# Patient Record
Sex: Female | Born: 1965 | Race: Black or African American | Hispanic: Yes | Marital: Married | Smoking: Former smoker
Health system: Southern US, Community
[De-identification: ages and names within clinical notes are randomized; demographics above are authoritative.]

## PROBLEM LIST (undated history)

## (undated) DIAGNOSIS — M797 Fibromyalgia: Secondary | ICD-10-CM

## (undated) DIAGNOSIS — I10 Essential (primary) hypertension: Secondary | ICD-10-CM

## (undated) DIAGNOSIS — F419 Anxiety disorder, unspecified: Secondary | ICD-10-CM

## (undated) HISTORY — PX: ORIF FINGER / THUMB FRACTURE: SUR932

## (undated) HISTORY — PX: ORIF ELBOW FRACTURE: SHX5031

## (undated) HISTORY — PX: FOOT SURGERY: SHX648

---

## 2005-11-02 ENCOUNTER — Inpatient Hospital Stay (HOSPITAL_COMMUNITY): Admission: EM | Admit: 2005-11-02 | Discharge: 2005-11-04 | Payer: Self-pay | Admitting: Emergency Medicine

## 2006-03-26 ENCOUNTER — Ambulatory Visit: Payer: Self-pay | Admitting: Psychiatry

## 2006-03-26 ENCOUNTER — Inpatient Hospital Stay (HOSPITAL_COMMUNITY): Admission: AD | Admit: 2006-03-26 | Discharge: 2006-03-30 | Payer: Self-pay | Admitting: Psychiatry

## 2007-04-01 ENCOUNTER — Emergency Department (HOSPITAL_COMMUNITY): Admission: EM | Admit: 2007-04-01 | Discharge: 2007-04-01 | Payer: Self-pay | Admitting: Emergency Medicine

## 2007-07-23 ENCOUNTER — Ambulatory Visit: Payer: Self-pay | Admitting: *Deleted

## 2007-07-23 ENCOUNTER — Emergency Department (HOSPITAL_COMMUNITY): Admission: EM | Admit: 2007-07-23 | Discharge: 2007-07-23 | Payer: Self-pay | Admitting: Emergency Medicine

## 2007-07-23 ENCOUNTER — Inpatient Hospital Stay (HOSPITAL_COMMUNITY): Admission: RE | Admit: 2007-07-23 | Discharge: 2007-07-26 | Payer: Self-pay | Admitting: *Deleted

## 2007-08-18 ENCOUNTER — Emergency Department (HOSPITAL_COMMUNITY): Admission: EM | Admit: 2007-08-18 | Discharge: 2007-08-19 | Payer: Self-pay | Admitting: Emergency Medicine

## 2007-12-19 ENCOUNTER — Emergency Department (HOSPITAL_COMMUNITY): Admission: EM | Admit: 2007-12-19 | Discharge: 2007-12-19 | Payer: Self-pay | Admitting: Emergency Medicine

## 2009-11-01 ENCOUNTER — Emergency Department (HOSPITAL_COMMUNITY): Admission: EM | Admit: 2009-11-01 | Discharge: 2009-11-01 | Payer: Self-pay | Admitting: Emergency Medicine

## 2010-02-17 ENCOUNTER — Encounter: Payer: Self-pay | Admitting: *Deleted

## 2010-02-17 ENCOUNTER — Encounter: Payer: Self-pay | Admitting: Internal Medicine

## 2010-04-11 LAB — RAPID URINE DRUG SCREEN, HOSP PERFORMED
Amphetamines: NOT DETECTED
Barbiturates: NOT DETECTED
Benzodiazepines: NOT DETECTED
Opiates: NOT DETECTED
Tetrahydrocannabinol: NOT DETECTED

## 2010-04-11 LAB — PROTIME-INR
INR: 1.03 (ref 0.00–1.49)
Prothrombin Time: 13.7 seconds (ref 11.6–15.2)

## 2010-04-11 LAB — POCT CARDIAC MARKERS
CKMB, poc: <1 ng/mL — ABNORMAL LOW (ref 1.0–8.0)
Myoglobin, poc: 22.3 ng/mL (ref 12–200)
Troponin i, poc: <0.05 ng/mL (ref 0.00–0.09)

## 2010-04-11 LAB — URINALYSIS, ROUTINE W REFLEX MICROSCOPIC
Hgb urine dipstick: NEGATIVE
Ketones, ur: NEGATIVE mg/dL
Protein, ur: NEGATIVE mg/dL
Urobilinogen, UA: 1 mg/dL (ref 0.0–1.0)

## 2010-04-11 LAB — CBC
MCHC: 31.2 g/dL (ref 30.0–36.0)
Platelets: 258 10*3/uL (ref 150–400)
RDW: 15.2 % (ref 11.5–15.5)

## 2010-04-11 LAB — DIFFERENTIAL
Basophils Absolute: 0 10*3/uL (ref 0.0–0.1)
Basophils Relative: 0 % (ref 0–1)
Eosinophils Absolute: 0.1 10*3/uL (ref 0.0–0.7)
Neutro Abs: 7.5 10*3/uL (ref 1.7–7.7)
Neutrophils Relative %: 70 % (ref 43–77)

## 2010-04-11 LAB — URINE MICROSCOPIC-ADD ON

## 2010-06-11 NOTE — Discharge Summary (Signed)
[name], SOLANA NO.:  [id number]   MEDICAL RECORD NO.:  [id number]          PATIENT TYPE:  IPS   LOCATION:  0604                          FACILITY:  [facility]   PHYSICIAN:  Jasmine Pang, M.D. DATE OF BIRTH:  [birth year]   DATE OF ADMISSION:  07/23/2007  DATE OF DISCHARGE:  07/26/2007                               DISCHARGE SUMMARY   IDENTIFICATION:  This is a 45 year old divorced African American female  who was admitted on a voluntary basis on July 23, 2007.   HISTORY OF PRESENT ILLNESS:  The patient called 911 on the day before  admission reporting that she felt suicidal after having a setback  smoking crack.  Apparently, she was last with [place] from March 27, 2007  - March 30, 2007.  She states she has never stopped drinking.  She did  use marijuana and cocaine on the day before admission.  However, she  states she used to use when people called her and stated they want to  use it with them.  She does not actually seek this out.  She does  acknowledge that she has a drinking problem and that is her drug of  choice.  Unfortunately, she lost her employment  back in April.  She is  facing foreclosure on her home and she reports severe mood swings.  She  wanders that she is bipolar.  She states her mother is currently doing  well on Depakote and suggested that her daughter consider this as well.   PAST PSYCHIATRIC HISTORY:  The patient was with [place] March 27, 2007, -  March 30, 2007.  She also has had hospitalizations in 2007 and 2008, at  [facility].  She currently, received unemployment and  child support.  She has no current psychiatric care.   FAMILY HISTORY:  The patient's mother is currently on Depakote.  Her  father was IV drug abuse and also abuse crack, but has been clean for a  number of years.   ALCOHOL AND DRUG HISTORY:  The patient reports issues with alcohol and  drugs for the past 7 years.   MEDICAL PROBLEMS:  The patient  reports chronic leg and arm pain.  She is  also aware of the fact that she is anemic.   MEDICATIONS:  The patient states she had been prescribed Paxil and Xanax  in the past.  She states she does take the Paxil on a daily basis and  only uses Xanax when needed.   DRUG ALLERGIES:  Penicillin.   PHYSICAL FINDINGS:  Her physical exam was done in the ED prior to  admission.  There were no abnormalities noted.  She was in no acute  distress.   DIAGNOSTIC STUDIES:  Alcohol level was 107.  UDS was positive for  cocaine and marijuana.  Other labs showed, that she is markedly anemic.  Her hemoglobin is 9.9, hematocrit is 30.7.  She had no other worrisome  lab values.   HOSPITAL COURSE:  Upon admission, the patient was started on Ambien 5 mg  p.o. q.h.s. p.r.n., Unasyn.  She was also  started on Librium detox  protocol.  On July 23, 2007, she was started on nicotine patch 14 mg  daily as per smoking cessation protocol.  On July 24, 2007, she was  started on Campral 666 mg t.i.d., and Depakote ER 250 mg p.o. b.i.d.  In  individual sessions with me, the patient was reserved, but cooperative.  She discussed her drinking and relapsed on crack cocaine.  She discussed  losing her job 2 months ago.  She stated I am tired of using.  She  lost her car and she may be losing her home.  She notes a lot of mood  swings.  She reports a lot of highs and lows.  She feels she may be  bipolar.  She feels that drinking is her main problem and she give up  the crack cocaine.  As hospitalization progressed, mental status  improved.  She was still having trouble sleeping and her Ambien was  increased from 5 mg q.h.s. to 10 mg q.h.s. p.r.n., insomnia.  This  improved her sleep.  Her mood became less depressed, less anxious.  She  stated, she wrote a lot of goals for herself and felt much better.  On  July 25, 2007, she wanted to go to some fellowship for followup and  plans to do this.  On July 26, 2007, mental  status had improved markedly  from admission status.  The patient was less depressed, less anxious.  Affect consistent with mood.  There was no suicidal or homicidal  ideation.  No thoughts of self-injurious behavior.  No auditory or  visual hallucinations.  No paranoia or delusions.  Thoughts were logical  and goal-directed.  Thought content, no predominant theme.  Cognitive  was grossly intact.  It was felt, the patient was safe for discharge.   DISCHARGE DIAGNOSES:  Axis I:  Alcohol dependence.  Polysubstance abuse.  Mood disorder, not otherwise specified.  Axis II:  None.  Axis III:  Chronic leg and arm pain.  She is anemic.  Axis IV:  Severe (substance abuse, occupational, economic issues,  pending housing issues, burden of psychiatric, and chemical dependence  illness).  Axis V:  Global assessment of functioning was 50 upon discharge.  GAF  was 30 upon admission.  GAF highest past year was 65-70.   DISCHARGE PLANS:  There was no specific activity level or dietary  restriction.   POSTHOSPITAL CARE PLANS:  The patient will go to the [facility] to  see Silver Huguenin on July 1 at 10:45 a.m.  She will also go to [facility] for counseling.  She is to call the intake department to get an  appointment with a counselor.   DISCHARGE MEDICATIONS:  1. Librium 25 mg 1 pill in the a.m. and 6:30 on July 27, 2007, and      then discontinue.  2. Campral 333 mg 2 pills t.i.d.  3. Depakote ER 500 mg at bedtime.  4. Ambien 10-20 mg at bedtime if needed for sleep.      Jasmine Pang, M.D.  Electronically Signed     BHS/MEDQ  D:  07/26/2007  T:  07/26/2007  Job:  [id number]

## 2010-06-11 NOTE — H&P (Signed)
NAMESAMYRA, LIMB             ACCOUNT NO.:  [id number]   MEDICAL RECORD NO.:  [id number]          PATIENT TYPE:  IPS   LOCATION:  0604                          FACILITY:  [facility]   PHYSICIAN:  Vic Ripper, P.A.-C.DATE OF BIRTH:  Sep 02, 1965   DATE OF ADMISSION:  07/23/2007  DATE OF DISCHARGE:                       PSYCHIATRIC ADMISSION ASSESSMENT   This is a 45 year old divorced African American female.  She called 911  yesterday reporting that she felt suicidal after having a setback and  smoking crack.  Apparently she was last with [place] March 27, 2007 to  March 30, 2007.  She states that she has never stopped drinking.  She did  use marijuana and cocaine yesterday, however, she states that she just  uses it when people call her and say do you want to use it.  She does  not actually seek that out.  She does acknowledge she has a drinking  problem and that that is her drug of choice.  Unfortunately, she lost  her employment back in April.  She is facing foreclosure on her home and  she reports severe mood swings.  She wonders if she is bipolar.  She  states her mom is currently doing well on Depakote and suggested that  her daughter considered this as well.   PAST PSYCHIATRIC HISTORY:  She was with [place] March 27, 2007 to March 30, 2007.  She also has had hospitalizations in 2007 and 2008 at [facility].  She currently receives unemployment and child  support.   SOCIAL HISTORY:  She obtained a B.A. in communications in 2000.  She has  been married and divorced twice.  She has 2 daughters, 97 and 32.  She  currently lives in a house.  She is unemployed since April.  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   FAMILY HISTORY:  Her mother is currently on Depakote.  Her father was an  IV drug abuser and also abused crack but has been clean for a number of  years now.   ALCOHOL AND DRUG HISTORY:  She reports issues with  alcohol and drugs for  the past 7 years.  Her primary care Cionna Collantes is a Dr. Concepcion Elk.  She has  no psychiatric care.   MEDICAL PROBLEMS:  She reports chronic leg and arm pain.  She also is  aware of the fact that she is anemic.   MEDICATIONS:  She states that she has been prescribed Paxil and Xanax in  the past.  She states that she does take the Paxil on a daily basis and  only uses the Xanax when needed.   DRUG ALLERGIES:  PENICILLIN.   POSITIVE PHYSICAL FINDINGS:  Her alcohol level was 107.  Her UDS was  positive for cocaine and marijuana.  Her other labs showed that she is  markedly anemic.  Her hemoglobin is 9.9, hematocrit is 30.7.  She had no  other worrisome lab values.   Her vital signs on admission to our unit showed that she 66 and 1/4  inches tall.  She weighs 151.  Her other vital signs showed that her  blood pressure was 132/57 to 115/73.  Her pulse ranged from 66 to 109.  Respirations 18 to 24.  She does not have any overt tremor or other  obvious signs of withdrawal from alcohol.   MENTAL STATUS EXAM:  Today she is alert and oriented.  She is casually  groomed and dressed.  Appears to be adequately nourished.  Her speech is  not pressured.  Her mood is depressed and anxious.  Her affect is  congruent.  Her thought processes are clear, rational, goal-oriented.  She wants to get a handle on her drinking.  Judgement and insight are  good.  Concentration and memory are good.  Intelligence is at least  average.  She is not actively suicidal.  She is not homicidal.  She does  not have any auditory or visual hallucinations.   DIAGNOSES:  AXIS I:  Polysubstance dependence.  She reports that her  drug of choice is actually alcohol.  Mood disorder, not otherwise  specified.  AXIS II:  Deferred.  AXIS III:  Chronic leg and arm pain.  She is anemic.  AXIS IV:  Substance use, occupational and economic issues, pending  housing issues.  AXIS V:  30.   PLAN:  Admit for  safety and stabilization.  She was started on a low-  dose Librium protocol to help withdraw her from alcohol.  We will adjust  her meds.  We will not restart the Paxil as this may be adding to her  mood swings.  We will start Campral 666 mg t.i.d. and Depakote ER 250 mg  b.i.d.  The case manager will help get her a medical appointment post  discharge and followup at [facility] so her meds can  be continued.  Estimated length of stay is 3-5 days.      Vic Ripper, P.A.-C.     MD/MEDQ  D:  07/24/2007  T:  07/24/2007  Job:  [id number]
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
## 2010-07-27 DIAGNOSIS — F39 Unspecified mood [affective] disorder: Secondary | ICD-10-CM

## 2010-07-28 ENCOUNTER — Emergency Department (HOSPITAL_COMMUNITY)
Admission: EM | Admit: 2010-07-28 | Discharge: 2010-07-28 | Disposition: A | Discharge status: Home / Self Care | Payer: Self-pay | Attending: Emergency Medicine | Admitting: Emergency Medicine

## 2010-07-28 DIAGNOSIS — R45851 Suicidal ideations: Secondary | ICD-10-CM | POA: Insufficient documentation

## 2010-07-28 DIAGNOSIS — F319 Bipolar disorder, unspecified: Secondary | ICD-10-CM | POA: Insufficient documentation

## 2010-07-28 DIAGNOSIS — F17200 Nicotine dependence, unspecified, uncomplicated: Secondary | ICD-10-CM | POA: Insufficient documentation

## 2010-07-28 LAB — RAPID URINE DRUG SCREEN, HOSP PERFORMED
Cocaine: NOT DETECTED
Opiates: NOT DETECTED
Tetrahydrocannabinol: NOT DETECTED

## 2010-07-28 LAB — DIFFERENTIAL
Basophils Absolute: 0 10*3/uL (ref 0.0–0.1)
Eosinophils Absolute: 0.2 10*3/uL (ref 0.0–0.7)
Lymphs Abs: 3.9 10*3/uL (ref 0.7–4.0)
Monocytes Absolute: 0.7 10*3/uL (ref 0.1–1.0)
Monocytes Relative: 6 % (ref 3–12)
Neutro Abs: 6.6 10*3/uL (ref 1.7–7.7)

## 2010-07-28 LAB — CBC
Hemoglobin: 9.4 g/dL — ABNORMAL LOW (ref 12.0–15.0)
MCH: 21 pg — ABNORMAL LOW (ref 26.0–34.0)
MCHC: 29.2 g/dL — ABNORMAL LOW (ref 30.0–36.0)
Platelets: 326 10*3/uL (ref 150–400)
RDW: 19.3 % — ABNORMAL HIGH (ref 11.5–15.5)

## 2010-07-28 LAB — BASIC METABOLIC PANEL
CO2: 23 mEq/L (ref 19–32)
Calcium: 10.3 mg/dL (ref 8.4–10.5)
Chloride: 104 mEq/L (ref 96–112)
Glucose, Bld: 99 mg/dL (ref 70–99)
Potassium: 3.6 mEq/L (ref 3.5–5.1)
Sodium: 139 mEq/L (ref 135–145)

## 2010-07-28 LAB — ETHANOL: Alcohol, Ethyl (B): 179 mg/dL — ABNORMAL HIGH (ref 0–11)

## 2010-08-23 ENCOUNTER — Emergency Department (HOSPITAL_COMMUNITY)
Admission: EM | Admit: 2010-08-23 | Discharge: 2010-08-23 | Discharge status: Against Medical Advice | Payer: Self-pay | Attending: Emergency Medicine | Admitting: Emergency Medicine

## 2010-08-23 ENCOUNTER — Emergency Department (HOSPITAL_COMMUNITY): Payer: Self-pay

## 2010-08-23 DIAGNOSIS — S0083XA Contusion of other part of head, initial encounter: Secondary | ICD-10-CM | POA: Insufficient documentation

## 2010-08-23 DIAGNOSIS — M79609 Pain in unspecified limb: Secondary | ICD-10-CM | POA: Insufficient documentation

## 2010-08-23 DIAGNOSIS — F319 Bipolar disorder, unspecified: Secondary | ICD-10-CM | POA: Insufficient documentation

## 2010-08-23 DIAGNOSIS — S0003XA Contusion of scalp, initial encounter: Secondary | ICD-10-CM | POA: Insufficient documentation

## 2010-08-23 DIAGNOSIS — Z79899 Other long term (current) drug therapy: Secondary | ICD-10-CM | POA: Insufficient documentation

## 2010-08-23 DIAGNOSIS — S40029A Contusion of unspecified upper arm, initial encounter: Secondary | ICD-10-CM | POA: Insufficient documentation

## 2010-10-24 LAB — DIFFERENTIAL
Lymphocytes Relative: 30
Lymphs Abs: 2.4
Monocytes Relative: 8
Neutrophils Relative %: 62

## 2010-10-24 LAB — COMPREHENSIVE METABOLIC PANEL
CO2: 23
Calcium: 9.3
Creatinine, Ser: 0.69
GFR calc Af Amer: >60
GFR calc non Af Amer: >60
Glucose, Bld: 105 — ABNORMAL HIGH
Sodium: 139
Total Protein: 7.3

## 2010-10-24 LAB — URINALYSIS, ROUTINE W REFLEX MICROSCOPIC
Glucose, UA: NEGATIVE
Leukocytes, UA: NEGATIVE
Nitrite: NEGATIVE
Specific Gravity, Urine: 1.023
pH: 7.5

## 2010-10-24 LAB — RAPID URINE DRUG SCREEN, HOSP PERFORMED
Amphetamines: NOT DETECTED
Benzodiazepines: NOT DETECTED
Cocaine: POSITIVE — AB

## 2010-10-24 LAB — URINE CULTURE
Colony Count: >=100000
Special Requests: NEGATIVE

## 2010-10-24 LAB — POCT PREGNANCY, URINE: Operator id: 22937

## 2010-10-24 LAB — CBC
Hemoglobin: 9.9 — ABNORMAL LOW
MCHC: 32.3
MCV: 82
RBC: 3.75 — ABNORMAL LOW
RDW: 19.3 — ABNORMAL HIGH

## 2010-10-24 LAB — URINE MICROSCOPIC-ADD ON

## 2010-10-24 LAB — GC/CHLAMYDIA PROBE AMP, URINE
Chlamydia, Swab/Urine, PCR: NEGATIVE
GC Probe Amp, Urine: NEGATIVE

## 2010-10-24 LAB — SALICYLATE LEVEL: Salicylate Lvl: <4

## 2010-10-24 LAB — TRICYCLICS SCREEN, URINE: TCA Scrn: NOT DETECTED

## 2010-10-24 LAB — ACETAMINOPHEN LEVEL: Acetaminophen (Tylenol), Serum: <10 — ABNORMAL LOW

## 2011-03-29 ENCOUNTER — Emergency Department (HOSPITAL_COMMUNITY)
Admission: EM | Admit: 2011-03-29 | Discharge: 2011-03-29 | Disposition: A | Discharge status: Home / Self Care | Payer: Self-pay | Attending: Emergency Medicine | Admitting: Emergency Medicine

## 2011-03-29 ENCOUNTER — Encounter (HOSPITAL_COMMUNITY): Payer: Self-pay | Admitting: Emergency Medicine

## 2011-03-29 DIAGNOSIS — IMO0002 Reserved for concepts with insufficient information to code with codable children: Secondary | ICD-10-CM | POA: Insufficient documentation

## 2011-03-29 DIAGNOSIS — R45851 Suicidal ideations: Secondary | ICD-10-CM

## 2011-03-29 DIAGNOSIS — R000 Tachycardia, unspecified: Secondary | ICD-10-CM | POA: Insufficient documentation

## 2011-03-29 HISTORY — DX: Fibromyalgia: M79.7

## 2011-03-29 LAB — SALICYLATE LEVEL: Salicylate Lvl: <2 mg/dL — ABNORMAL LOW (ref 2.8–20.0)

## 2011-03-29 LAB — COMPREHENSIVE METABOLIC PANEL
ALT: 18 U/L (ref 0–35)
AST: 22 U/L (ref 0–37)
Alkaline Phosphatase: 69 U/L (ref 39–117)
CO2: 20 mEq/L (ref 19–32)
Chloride: 104 mEq/L (ref 96–112)
GFR calc non Af Amer: >90 mL/min (ref >90)
Glucose, Bld: 115 mg/dL — ABNORMAL HIGH (ref 70–99)
Sodium: 139 mEq/L (ref 135–145)
Total Bilirubin: 0.2 mg/dL — ABNORMAL LOW (ref 0.3–1.2)

## 2011-03-29 LAB — CBC
Hemoglobin: 14.8 g/dL (ref 12.0–15.0)
Platelets: 216 10*3/uL (ref 150–400)
RBC: 4.79 MIL/uL (ref 3.87–5.11)
WBC: 9.4 10*3/uL (ref 4.0–10.5)

## 2011-03-29 LAB — RAPID URINE DRUG SCREEN, HOSP PERFORMED
Amphetamines: NOT DETECTED
Barbiturates: NOT DETECTED
Tetrahydrocannabinol: NOT DETECTED

## 2011-03-29 LAB — POCT PREGNANCY, URINE: Preg Test, Ur: NEGATIVE

## 2011-03-29 MED ORDER — IBUPROFEN 600 MG PO TABS: 600.0000 mg | ORAL_TABLET | Freq: Three times a day (TID) | ORAL | Status: DC | PRN

## 2011-03-29 MED ORDER — ALUM & MAG HYDROXIDE-SIMETH 200-200-20 MG/5ML PO SUSP: 30.0000 mL | ORAL | Status: DC | PRN

## 2011-03-29 MED ORDER — ZIPRASIDONE MESYLATE 20 MG IM SOLR
10.0000 mg | Freq: Once | INTRAMUSCULAR | Status: DC
  Filled 2011-03-29: qty 20

## 2011-03-29 MED ORDER — ZIPRASIDONE MESYLATE 20 MG IM SOLR
10.0000 mg | Freq: Once | INTRAMUSCULAR | Status: AC
  Administered 2011-03-29: 10 mg via INTRAMUSCULAR

## 2011-03-29 MED ORDER — ZOLPIDEM TARTRATE 5 MG PO TABS: 5.0000 mg | ORAL_TABLET | Freq: Every evening | ORAL | Status: DC | PRN

## 2011-03-29 MED ORDER — LORAZEPAM 1 MG PO TABS: 1.0000 mg | ORAL_TABLET | Freq: Three times a day (TID) | ORAL | Status: DC | PRN

## 2011-03-29 MED ORDER — ONDANSETRON HCL 4 MG PO TABS: 4.0000 mg | ORAL_TABLET | Freq: Three times a day (TID) | ORAL | Status: DC | PRN

## 2011-03-29 NOTE — ED Notes (Signed)
Received pt from Triage, report from Marinda Elk Rn  , pt currently sleeping,  NAD

## 2011-03-29 NOTE — ED Provider Notes (Signed)
Patient is sleeping comfortably now.   Hilario Quarry, MD 03/29/11 [id number]
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
## 2011-03-29 NOTE — Discharge Instructions (Signed)

## 2011-03-29 NOTE — ED Provider Notes (Signed)
10:23 AM TelePsych Consult advises that the patient be discharged.  IVC has been rescinded.  She will be d/c w resources for outpatient care.  Gerhard Munch, MD 03/29/11 1024

## 2011-03-29 NOTE — ED Notes (Signed)
Pt has been changed into [name] scrubs and wanded by security. Pt has 2 belonging bags

## 2011-07-07 NOTE — ED Notes (Signed)
History/physical exam/procedure(s) were performed by non-physician practitioner and as supervising physician I was immediately available for consultation/collaboration. I have reviewed all notes and am in agreement with care and plan.   Hilario Quarry, MD 07/07/11 [phone]

## 2015-10-31 DIAGNOSIS — N951 Menopausal and female climacteric states: Secondary | ICD-10-CM | POA: Insufficient documentation

## 2017-11-10 ENCOUNTER — Ambulatory Visit (INDEPENDENT_AMBULATORY_CARE_PROVIDER_SITE_OTHER): Payer: BLUE CROSS/BLUE SHIELD

## 2017-11-10 ENCOUNTER — Ambulatory Visit (INDEPENDENT_AMBULATORY_CARE_PROVIDER_SITE_OTHER): Payer: BLUE CROSS/BLUE SHIELD | Admitting: Podiatry

## 2017-11-10 ENCOUNTER — Encounter: Payer: Self-pay | Admitting: Podiatry

## 2017-11-10 VITALS — BP 130/83 | HR 79 | Resp 16

## 2017-11-10 DIAGNOSIS — M779 Enthesopathy, unspecified: Secondary | ICD-10-CM

## 2017-11-10 DIAGNOSIS — M2012 Hallux valgus (acquired), left foot: Secondary | ICD-10-CM | POA: Diagnosis not present

## 2017-11-10 DIAGNOSIS — M778 Other enthesopathies, not elsewhere classified: Secondary | ICD-10-CM

## 2017-11-10 NOTE — Progress Notes (Signed)
  Subjective:  Patient ID: [name], female    DOB: 07-21-1965,  MRN: [id number] HPI Chief Complaint  Patient presents with  . Foot Pain    1st MPJ left - aching x several months, ROM limited, shoes uncomfortable, painful walking long periods of time, surgery on right foot 10 years and its still sensitive at times    52 y.o. female presents with the above complaint.   ROS: Denies fever chills nausea vomiting muscle aches pains calf pain back pain chest pain shortness of breath.  Past Medical History:  Diagnosis Date  . Fibromyalgia    No past surgical history on file. No current outpatient medications on file.  Allergies  Allergen Reactions  . Penicillin G Rash   Review of Systems Objective:   Vitals:   11/10/17 0859  BP: 130/83  Pulse: 79  Resp: 16    General: Well developed, nourished, in no acute distress, alert and oriented x3   Dermatological: Skin is warm, dry and supple bilateral. Nails x 10 are well maintained; remaining integument appears unremarkable at this time. There are no open sores, no preulcerative lesions, no rash or signs of infection present.  Vascular: Dorsalis Pedis artery and Posterior Tibial artery pedal pulses are 2/4 bilateral with immedate capillary fill time. Pedal hair growth present. No varicosities and no lower extremity edema present bilateral.   Neruologic: Grossly intact via light touch bilateral. Vibratory intact via tuning fork bilateral. Protective threshold with Semmes Wienstein monofilament intact to all pedal sites bilateral. Patellar and Achilles deep tendon reflexes 2+ bilateral. No Babinski or clonus noted bilateral.   Musculoskeletal: No gross boney pedal deformities bilateral. No pain, crepitus, or limitation noted with foot and ankle range of motion bilateral. Muscular strength 5/5 in all groups tested bilateral.  Limited range of motion with pain first metatarsal phalangeal joint left elevated first metatarsal visible.   Minimal spurring dorsally noted.  Right foot demonstrates a painful screw which is very sensitive.  She also has limited range of motion on dorsiflexion with dorsal spurring.  Gait: Unassisted, Nonantalgic.    Radiographs:  Radiographs taken today bilaterally demonstrate a cotton osteotomy on the right foot with a Keller arthroplasty and single silicone implant which demonstrates good alignment however the first metatarsal phalangeal joint demonstrate some spurring as it that has been injured postoperatively but patient denies.  There is some soft tissue swelling and some spurring noted.  Left foot demonstrates elevated first metatarsal joint space narrowing of the first metatarsal phalangeal joint.  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  Right spurring about the first metatarsophalangeal joint.  With capsulitis.  Also painful internal fixation.  Plan: Discussed etiology pathology conservative versus surgical therapies discussed performing bilateral surgery consisting of a Keller arthroplasty single silicone implant left as well as a removal of internal fixation and a redo of the Keller arthroplasty.  I also injected the first metatarsophalangeal joint 2 mg of dexamethasone and local anesthetic left foot directly into the joint.  Tolerated procedure well without complications follow-up with her in 6 weeks if not improved consider surgery.     Indiah Heyden T. [place], [place]

## 2017-12-10 ENCOUNTER — Encounter: Payer: Self-pay | Admitting: Podiatry

## 2017-12-10 ENCOUNTER — Ambulatory Visit (INDEPENDENT_AMBULATORY_CARE_PROVIDER_SITE_OTHER): Payer: BLUE CROSS/BLUE SHIELD | Admitting: Podiatry

## 2017-12-10 DIAGNOSIS — M2012 Hallux valgus (acquired), left foot: Secondary | ICD-10-CM

## 2017-12-10 NOTE — Patient Instructions (Signed)
Pre-Operative Instructions  Congratulations, you have decided to take an important step towards improving your quality of life.  You can be assured that the doctors and staff at [facility] will be with you every step of the way.  Here are some important things you should know:  1. Plan to be at the surgery center/hospital at least 1 (one) hour prior to your scheduled time, unless otherwise directed by the surgical center/hospital staff.  You must have a responsible adult accompany you, remain during the surgery and drive you home.  Make sure you have directions to the surgical center/hospital to ensure you arrive on time. 2. If you are having surgery at [facility] or [facility] hospitals, you will need a copy of your medical history and physical form from your family physician within one month prior to the date of surgery. We will give you a form for your primary physician to complete.  3. We make every effort to accommodate the date you request for surgery.  However, there are times where surgery dates or times have to be moved.  We will contact you as soon as possible if a change in schedule is required.   4. No aspirin/ibuprofen for one week before surgery.  If you are on aspirin, any non-steroidal anti-inflammatory medications (Mobic, Aleve, Ibuprofen) should not be taken seven (7) days prior to your surgery.  You make take Tylenol for pain prior to surgery.  5. Medications - If you are taking daily heart and blood pressure medications, seizure, reflux, allergy, asthma, anxiety, pain or diabetes medications, make sure you notify the surgery center/hospital before the day of surgery so they can tell you which medications you should take or avoid the day of surgery. 6. No food or drink after midnight the night before surgery unless directed otherwise by surgical center/hospital staff. 7. No alcoholic beverages 24-hours prior to surgery.  No smoking 24-hours prior or 24-hours after  surgery. 8. Wear loose pants or shorts. They should be loose enough to fit over bandages, boots, and casts. 9. Don't wear slip-on shoes. Sneakers are preferred. 10. Bring your boot with you to the surgery center/hospital.  Also bring crutches or a walker if your physician has prescribed it for you.  If you do not have this equipment, it will be provided for you after surgery. 11. If you have not been contacted by the surgery center/hospital by the day before your surgery, call to confirm the date and time of your surgery. 12. Leave-time from work may vary depending on the type of surgery you have.  Appropriate arrangements should be made prior to surgery with your employer. 13. Prescriptions will be provided immediately following surgery by your doctor.  Fill these as soon as possible after surgery and take the medication as directed. Pain medications will not be refilled on weekends and must be approved by the doctor. 14. Remove nail polish on the operative foot and avoid getting pedicures prior to surgery. 15. Wash the night before surgery.  The night before surgery wash the foot and leg well with water and the antibacterial soap provided. Be sure to pay special attention to beneath the toenails and in between the toes.  Wash for at least three (3) minutes. Rinse thoroughly with water and dry well with a towel.  Perform this wash unless told not to do so by your physician.  Enclosed: 1 Ice pack (please put in freezer the night before surgery)   1 Hibiclens skin cleaner     Pre-op instructions  If you have any questions regarding the instructions, please do not hesitate to call our office.  [place]: [address], [place], [place] [address] -- [phone]  [place]: [address] [place], [place] [address] -- [phone]  [place]: [address]  [place], [place] [address] -- [phone]  [place]: [address], [address], [place],  [address] -- [phone]  Website:  [email or web address] 

## 2017-12-13 ENCOUNTER — Encounter: Payer: Self-pay | Admitting: Podiatry

## 2017-12-13 NOTE — Progress Notes (Signed)
She presents today for follow-up of painful first metatarsal phalangeal joint of the left foot.  States that the shot really did not help too much.  I would like to go ahead and see about having surgery.  Objective: Vital signs are stable alert and oriented x3 I reviewed her past medical history medications allergy surgeries social history and review of systems.  She has painful limited range of motion of the first metatarsophalangeal joint of the left foot and would like to consider surgical intervention.  Assessment: Hallux limitus first metatarsophalangeal joint left.  Plan: After further discussion [facility] with a single silicone implant first metatarsophalangeal joint of the left foot.  We discussed the possible postop complications which may include but are not limited to postop pain bleeding swelling infection recurrence need for further surgery overcorrection under correction loss of digit loss of limb loss of life.  She understands all of this and will follow-up with me in the near future for surgical intervention we dispensed today both oral and written home-going instructions for her morning of preop, anesthesia group as well as the surgery center.  She is also dispensed a Cam walker.

## 2017-12-14 ENCOUNTER — Telehealth: Payer: Self-pay | Admitting: *Deleted

## 2017-12-14 NOTE — Telephone Encounter (Signed)
"  I am a patient of Dr. [name].  I am calling to schedule my surgery."  Dr. [name] does surgeries on Fridays.  Do you have a date in mind?  "I'd like to wait until January.  Can he do it on January 20?"  Yes, he can do it on January 10.  I'll get it scheduled.  Someone from the surgical center will call you a day or two prior to your surgical date and they will give you your arrival.  You need to register with the surgical center via their [employer].  "Okay thank you for your assistance."

## 2017-12-28 ENCOUNTER — Telehealth: Payer: Self-pay | Admitting: *Deleted

## 2017-12-28 NOTE — Telephone Encounter (Signed)
"  I'm calling to confirm that my surgery is scheduled for January 10.  I wanted to just get a confirmation.  I'm a patient of Dr. [name].  If you would be so kind to give me a call back.  I'd greatly appreciate it."  "Hi Ms. Carole [place]ningMeadows, I am calling you again.   I have a couple of questions for you regarding my surgery that I scheduled with you.  I guess [facility] need [facility] disability/fmla.  Thank you so much, If you'd return the call, I'd appreciate it."  "I'm inundating you with [phone messages.  I'm scheduled for surgery February 05, 2018.  I need to confirm that.  [facility].  I want to confirm everybody is kind of in sync.  I appreciate it very much.  Just to make sure that all of that kind of paperwork is good from my end for when I am out of the office because of the surgery.  If you would just give me a call back to confirm, I'd greatly appreciate it."

## 2017-12-28 NOTE — Telephone Encounter (Signed)
"  I know that last week, I tried you a few times but you might be out on vacation for the Holiday.  I just wanted to confirm, as I spoke with [name] and did receive that confirmation, I wanted to check with you since I made the initial call to schedule the surgery with you that everything is set for my surgery with Dr. [name] on January 10.  I just want to confirm this with you as well."

## 2017-12-29 NOTE — Telephone Encounter (Signed)
I am returning your call.  I apologize for not getting back to you sooner.  "No apologies needed I know it gets hectic around the Holidays."  I do have you scheduled for surgery on 02/05/2018 with Dr. [name].  "Okay, I am trying to be proactive.  Do you know of anything else I need to do?"  Have you registered with the surgical center online?  "I have already done that.  Will someone call me a couple of days before my surgery and let me know the time?"  Yes, someone will call you from the surgical center a day or two prior to your surgery date and they will give you your arrival time.  "Thank you so much."

## 2018-02-03 ENCOUNTER — Other Ambulatory Visit: Payer: Self-pay | Admitting: Podiatry

## 2018-02-03 MED ORDER — ONDANSETRON HCL 4 MG PO TABS: 4.0000 mg | ORAL_TABLET | Freq: Three times a day (TID) | ORAL | 0 refills | Status: DC | PRN

## 2018-02-03 MED ORDER — CLINDAMYCIN HCL 150 MG PO CAPS: 150.0000 mg | ORAL_CAPSULE | Freq: Three times a day (TID) | ORAL | 0 refills | Status: DC

## 2018-02-03 MED ORDER — OXYCODONE-ACETAMINOPHEN 10-325 MG PO TABS: 1.0000 | ORAL_TABLET | Freq: Four times a day (QID) | ORAL | 0 refills | Status: AC | PRN

## 2018-02-05 ENCOUNTER — Encounter: Payer: Self-pay | Admitting: Podiatry

## 2018-02-05 DIAGNOSIS — M2022 Hallux rigidus, left foot: Secondary | ICD-10-CM | POA: Diagnosis not present

## 2018-02-11 ENCOUNTER — Ambulatory Visit (INDEPENDENT_AMBULATORY_CARE_PROVIDER_SITE_OTHER): Payer: Self-pay | Admitting: Podiatry

## 2018-02-11 ENCOUNTER — Ambulatory Visit (INDEPENDENT_AMBULATORY_CARE_PROVIDER_SITE_OTHER): Payer: BLUE CROSS/BLUE SHIELD

## 2018-02-11 VITALS — Temp 98.5°F

## 2018-02-11 DIAGNOSIS — M779 Enthesopathy, unspecified: Secondary | ICD-10-CM

## 2018-02-11 DIAGNOSIS — M778 Other enthesopathies, not elsewhere classified: Secondary | ICD-10-CM

## 2018-02-11 DIAGNOSIS — M2012 Hallux valgus (acquired), left foot: Secondary | ICD-10-CM | POA: Diagnosis not present

## 2018-02-11 NOTE — Progress Notes (Signed)
She presents today for first postop visit date of surgery February 05, 2018 status post [facility] bunion implant left.  She states that it feels okay Aveline been taking Tylenol and ibuprofen no Percocet.  Objective: Vital signs are stable she alert oriented x3.  Rest of dressing intact was removed demonstrates sutures are intact there is considerable swelling in the first intermetatarsal space.  Assessment: Well-healing surgical foot.  Plan: Encourage range of motion exercises redressed today dressed a compressive dressing.

## 2018-02-18 ENCOUNTER — Ambulatory Visit (INDEPENDENT_AMBULATORY_CARE_PROVIDER_SITE_OTHER): Payer: BLUE CROSS/BLUE SHIELD | Admitting: Podiatry

## 2018-02-18 DIAGNOSIS — M2012 Hallux valgus (acquired), left foot: Secondary | ICD-10-CM

## 2018-02-18 DIAGNOSIS — Z09 Encounter for follow-up examination after completed treatment for conditions other than malignant neoplasm: Secondary | ICD-10-CM

## 2018-02-18 NOTE — Progress Notes (Signed)
She presents today date of surgery February 05, 2018 status post [facility] bunionectomy left.  States my foot is been a little sore lately but have been on it more than it should have been.  She denies fever chills nausea vomiting muscle aches pains calf pain back pain chest pain shortness of breath.  Objective: Vital signs are stable she is alert and oriented x3.  Minimal edema.  No erythema cellulitis drainage odor hallux left and first metatarsophalangeal joint left has good range of motion and nontender.  Sutures are intact margins well coapted.  Assessment: Well-healing surgical foot left.  Plan: Patient compression anklet today and a Darco shoe will follow-up with her in 2 weeks at which time we hope to try to get her into a loose fitting tennis shoe.

## 2018-03-04 ENCOUNTER — Ambulatory Visit (INDEPENDENT_AMBULATORY_CARE_PROVIDER_SITE_OTHER): Payer: BLUE CROSS/BLUE SHIELD

## 2018-03-04 ENCOUNTER — Ambulatory Visit (INDEPENDENT_AMBULATORY_CARE_PROVIDER_SITE_OTHER): Payer: Self-pay | Admitting: Podiatry

## 2018-03-04 DIAGNOSIS — M2012 Hallux valgus (acquired), left foot: Secondary | ICD-10-CM

## 2018-03-04 DIAGNOSIS — Z09 Encounter for follow-up examination after completed treatment for conditions other than malignant neoplasm: Secondary | ICD-10-CM

## 2018-03-04 NOTE — Progress Notes (Signed)
She presents today date of surgery February 05, 2018 status post [name] bunion implant left states that my foot feels okay my ankles starting to bother him a little bit though.  Objective: Vital signs are stable she alert oriented x3.  Pulses are palpable.  He still has tenderness on range of motion of the first metatarsophalangeal joint of her left foot.  She states that she has not been doing her physical therapy and she failed to show for her last postop visit.  Assessment: Slowly healing Keller bunion repair left.  Approximately 1 month out now.  Plan: Encourage contrast baths range of motion exercises and shoe gear.  I will follow-up with her in 2 weeks

## 2018-03-18 ENCOUNTER — Other Ambulatory Visit: Payer: BLUE CROSS/BLUE SHIELD

## 2018-04-01 ENCOUNTER — Ambulatory Visit (INDEPENDENT_AMBULATORY_CARE_PROVIDER_SITE_OTHER): Payer: Self-pay | Admitting: Podiatry

## 2018-04-01 ENCOUNTER — Ambulatory Visit (INDEPENDENT_AMBULATORY_CARE_PROVIDER_SITE_OTHER): Payer: BLUE CROSS/BLUE SHIELD

## 2018-04-01 DIAGNOSIS — Z09 Encounter for follow-up examination after completed treatment for conditions other than malignant neoplasm: Secondary | ICD-10-CM

## 2018-04-01 DIAGNOSIS — M2012 Hallux valgus (acquired), left foot: Secondary | ICD-10-CM

## 2018-04-01 NOTE — Progress Notes (Signed)
She presents today date of surgery February 05, 2018 was status post [name] arthroplasty with single silicone implant states that I am doing pretty good and try to walk in regular shoes the best I can but I still have a little pain every once in a while.  Objective: Vital signs are stable she is alert and oriented x3 she has great range of motion of the first metatarsophalangeal joint left foot with minimal edema.  Radiographs taken today demonstrate a well-healing Keller arthroplasty with a single silicone implant.  Mild edema in the first interspace.  Assessment: Well-healing surgical foot.  Plan: Encourage range of motion exercises demonstrated these to her we will follow-up with her in 1 month.

## 2018-04-27 ENCOUNTER — Encounter: Payer: BLUE CROSS/BLUE SHIELD | Admitting: Podiatry

## 2018-04-28 NOTE — Progress Notes (Signed)
Pt cancelled

## 2018-04-29 ENCOUNTER — Encounter: Payer: BLUE CROSS/BLUE SHIELD | Admitting: Podiatry

## 2018-05-04 ENCOUNTER — Ambulatory Visit (INDEPENDENT_AMBULATORY_CARE_PROVIDER_SITE_OTHER): Payer: BLUE CROSS/BLUE SHIELD

## 2018-05-04 ENCOUNTER — Encounter: Payer: Self-pay | Admitting: Podiatry

## 2018-05-04 ENCOUNTER — Other Ambulatory Visit: Payer: Self-pay

## 2018-05-04 ENCOUNTER — Ambulatory Visit (INDEPENDENT_AMBULATORY_CARE_PROVIDER_SITE_OTHER): Payer: BLUE CROSS/BLUE SHIELD | Admitting: Podiatry

## 2018-05-04 VITALS — Temp 97.5°F

## 2018-05-04 DIAGNOSIS — M2012 Hallux valgus (acquired), left foot: Secondary | ICD-10-CM | POA: Diagnosis not present

## 2018-05-04 DIAGNOSIS — Z9889 Other specified postprocedural states: Secondary | ICD-10-CM

## 2018-05-05 ENCOUNTER — Encounter: Payer: Self-pay | Admitting: Podiatry

## 2018-05-05 NOTE — Progress Notes (Signed)
She presents today for her final postop visit date of surgery February 05, 2018 status post Lorenz Coaster bunionectomy with single silicone implant she states that is really feeling pretty good I feel like I am ready to get back to my regular routine.  Objective: Vital signs are stable she is alert and oriented x3 there is some mild edema about the first intermetatarsal space of the left foot otherwise she has great range of motion of the first metatarsophalangeal joint with no pain.  Assessment: Well-healing surgical foot.  Plan: Follow-up with me on an as-needed basis.  Radiographs taken today demonstrate an osseously mature individual with a Keller arthroplasty with single silicone implants and grommets all in good position moderate edema first intermetatarsal space.  No acute findings.

## 2018-05-13 ENCOUNTER — Telehealth: Payer: Self-pay | Admitting: *Deleted

## 2018-05-13 NOTE — Telephone Encounter (Signed)
[facility]am request confirmation of last clinical visit, and return to work date.

## 2018-07-15 ENCOUNTER — Ambulatory Visit: Payer: BLUE CROSS/BLUE SHIELD | Admitting: Podiatry

## 2018-08-19 ENCOUNTER — Ambulatory Visit: Payer: Self-pay | Admitting: Podiatry

## 2018-10-27 ENCOUNTER — Other Ambulatory Visit: Payer: Self-pay

## 2018-10-27 ENCOUNTER — Encounter: Payer: Self-pay | Admitting: Family Medicine

## 2018-10-27 ENCOUNTER — Ambulatory Visit (INDEPENDENT_AMBULATORY_CARE_PROVIDER_SITE_OTHER): Payer: 59 | Admitting: Family Medicine

## 2018-10-27 VITALS — BP 132/96 | HR 93 | Temp 98.4°F | Ht 66.0 in | Wt 216.2 lb

## 2018-10-27 DIAGNOSIS — R002 Palpitations: Secondary | ICD-10-CM

## 2018-10-27 DIAGNOSIS — Z5181 Encounter for therapeutic drug level monitoring: Secondary | ICD-10-CM | POA: Diagnosis not present

## 2018-10-27 DIAGNOSIS — Z1239 Encounter for other screening for malignant neoplasm of breast: Secondary | ICD-10-CM | POA: Diagnosis not present

## 2018-10-27 DIAGNOSIS — Z114 Encounter for screening for human immunodeficiency virus [HIV]: Secondary | ICD-10-CM

## 2018-10-27 DIAGNOSIS — Z8249 Family history of ischemic heart disease and other diseases of the circulatory system: Secondary | ICD-10-CM

## 2018-10-27 DIAGNOSIS — Z1211 Encounter for screening for malignant neoplasm of colon: Secondary | ICD-10-CM | POA: Diagnosis not present

## 2018-10-27 DIAGNOSIS — N951 Menopausal and female climacteric states: Secondary | ICD-10-CM

## 2018-10-27 DIAGNOSIS — R9431 Abnormal electrocardiogram [ECG] [EKG]: Secondary | ICD-10-CM

## 2018-10-27 DIAGNOSIS — M797 Fibromyalgia: Secondary | ICD-10-CM

## 2018-10-27 DIAGNOSIS — Z23 Encounter for immunization: Secondary | ICD-10-CM

## 2018-10-27 DIAGNOSIS — R079 Chest pain, unspecified: Secondary | ICD-10-CM

## 2018-10-27 NOTE — Patient Instructions (Addendum)
   1. Follow up with Apothecary 2. Follow up with Cardiology once referral is placed 3. Follow up with colonoscopy 11/05/2018 4. Return to clinic 1-2 week after Cardiology appointment 5. Try [place]   If you have lab work done today you will be contacted with your lab results within the next 2 weeks.  If you have not heard from [place] then please contact us. The fastest way to get your results is to register for My Chart.   IF you received an x-ray today, you will receive an invoice from [facility]. Please contact [facility] Radiology at [phone] with questions or concerns regarding your invoice.   IF you received labwork today, you will receive an invoice from [facility]. Please contact [facility] at [phone] with questions or concerns regarding your invoice.   Our billing staff will not be able to assist you with questions regarding bills from these companies.  You will be contacted with the lab results as soon as they are available. The fastest way to get your results is to activate your My Chart account. Instructions are located on the last page of this paperwork. If you have not heard from [place] regarding the results in 2 weeks, please contact this office.

## 2018-10-27 NOTE — Progress Notes (Signed)
New Patient Office Visit  Subjective:  Patient ID: [name], female    DOB: [birth year]  Age: 53 y.o. MRN: [id number]  CC:  Chief Complaint  Patient presents with  . Establish Care    questions on Menopause and med for it   . Fibromyalgia    ongoing pain. questions on management     HPI [name] presents for   Patient reports that she has been having hot flashes, mood swings Postmenopausal for one year She stopped bleeding in 2018 She states that she sees Dr. [name] She is s/p tubal ligation and c-section but no hysterectomy She reports that she feels like her flashes and mood swings are full blown right now She thinks it is more anxiety but she is not depressed Depression screen PHQ 2/9 10/27/2018  Decreased Interest 0  Down, Depressed, Hopeless 0  PHQ - 2 Score 0   She is a [employer]'s Witness  Fibromyalgia She has a history of fibromyalgia as well She states that she has ongoing pain She has a diagnosis of fibromyalgia She reports that she has muscular pain  She denies bone pain, headaches or bladder issues She has fatigue Her periods stopped She reports that she has not had any management of her fibromyalgia She was trying meds such as Cymbalta but she gained weight.   She states that she would gain weight which worsened her pain. She states that is not exercising consistently She may use essential oils but nothing routine.    Past Medical History:  Diagnosis Date  . Fibromyalgia     No past surgical history on file.  No family history on file.  Social History   Socioeconomic History  . Marital status: Married    Spouse name: Not on file  . Number of children: Not on file  . Years of education: Not on file  . Highest education level: Not on file  Occupational History  . Not on file  Social Needs  . Financial resource strain: Not on file  . Food insecurity    Worry: Not on file    Inability: Not on file  . Transportation needs   Medical: Not on file    Non-medical: Not on file  Tobacco Use  . Smoking status: Former Research scientist (life sciences)  . Smokeless tobacco: Never Used  Substance and Sexual Activity  . Alcohol use: Yes    Comment: occassional  . Drug use: No  . Sexual activity: Not on file  Lifestyle  . Physical activity    Days per week: Not on file    Minutes per session: Not on file  . Stress: Not on file  Relationships  . Social Herbalist on phone: Not on file    Gets together: Not on file    Attends religious service: Not on file    Active member of club or organization: Not on file    Attends meetings of clubs or organizations: Not on file    Relationship status: Not on file  . Intimate partner violence    Fear of current or ex partner: Not on file    Emotionally abused: Not on file    Physically abused: Not on file    Forced sexual activity: Not on file  Other Topics Concern  . Not on file  Social History Narrative  . Not on file    ROS Review of Systems Review of Systems  Constitutional: Negative for activity change, appetite change, chills and fever.  HENT: Negative for congestion, nosebleeds, trouble swallowing and voice change.   Respiratory: Negative for cough, shortness of breath and wheezing.   Gastrointestinal: Negative for diarrhea, nausea and vomiting.  Genitourinary: Negative for difficulty urinating, dysuria, flank pain and hematuria.  Musculoskeletal: Negative for back pain, joint swelling and neck pain.  Neurological: Negative for dizziness, speech difficulty, light-headedness and numbness.  See HPI. All other review of systems negative.   Objective:   Today's Vitals: BP (!) 132/96   Pulse 93   Temp 98.4 F (36.9 C) (Oral)   Ht 5\' 6"  (1.676 m)   Wt 216 lb 3.2 oz (98.1 kg)   SpO2 93%   BMI 34.90 kg/m   Physical Exam   Physical Exam  Constitutional: Oriented to person, place, and time. Appears well-developed and well-nourished.  HENT:  Head: Normocephalic and  atraumatic.  Eyes: Conjunctivae and EOM are normal.  Cardiovascular: Normal rate, regular rhythm, normal heart sounds and intact distal pulses.  No murmur heard. Pulmonary/Chest: Effort normal and breath sounds normal. No stridor. No respiratory distress. Has no wheezes.  Neurological: Is alert and oriented to person, place, and time.  Skin: Skin is warm. Capillary refill takes less than 2 seconds.  Psychiatric: Has a normal mood and affect. Behavior is normal. Judgment and thought content normal.     ECG sinus rhythm no arrhythmia, poor rwave progression  Assessment & Plan:   Problem List Items Addressed This Visit    None    Visit Diagnoses    Screening for colon cancer    -  Primary   Screening for breast cancer       Screening for HIV (human immunodeficiency virus)       Need for prophylactic vaccination and inoculation against influenza       Hot flashes due to menopause       Fibromyalgia       Relevant Medications   venlafaxine XR (EFFEXOR-XR) 75 MG 24 hr capsule   Encounter for medication monitoring       Relevant Orders   EKG 12-Lead (Completed)   Nonspecific abnormal electrocardiogram (ECG) (EKG)       Relevant Orders   ECHOCARDIOGRAM STRESS TEST   Family history of heart disease       Relevant Orders   ECHOCARDIOGRAM STRESS TEST     Family history of heart attack and stroke and is s/p multipe CABG surgeries Postmenopausal female with abnormal ecg and history of fibromyalgia Will refer for repeat testing Patient should follow up after Cardiology  Fibromyalgia -  Discussed currently following up with Apothecary Discussed also following up with a chiropractor   Outpatient Encounter Medications as of 10/27/2018  Medication Sig  . ondansetron (ZOFRAN) 4 MG tablet Take 1 tablet (4 mg total) by mouth every 8 (eight) hours as needed for nausea or vomiting.  . venlafaxine XR (EFFEXOR-XR) 75 MG 24 hr capsule Take 75 mg by mouth daily.   No facility-administered  encounter medications on file as of 10/27/2018.    A total of 40 minutes were spent face-to-face with the patient during this encounter and over half of that time was spent on counseling and coordination of care.  Follow-up: No follow-ups on file.   10/29/2018, MD

## 2018-11-03 NOTE — Addendum Note (Signed)
Addended by: Delia Chimes A on: 11/03/2018 09:39 AM   Modules accepted: Orders

## 2018-11-04 ENCOUNTER — Other Ambulatory Visit: Payer: Self-pay

## 2018-11-04 ENCOUNTER — Encounter: Payer: Self-pay | Admitting: Cardiology

## 2018-11-04 ENCOUNTER — Ambulatory Visit (INDEPENDENT_AMBULATORY_CARE_PROVIDER_SITE_OTHER): Payer: Managed Care, Other (non HMO) | Admitting: Cardiology

## 2018-11-04 VITALS — BP 142/102 | HR 79 | Temp 97.1°F | Ht 66.0 in | Wt 211.0 lb

## 2018-11-04 DIAGNOSIS — Z7189 Other specified counseling: Secondary | ICD-10-CM | POA: Diagnosis not present

## 2018-11-04 DIAGNOSIS — Z713 Dietary counseling and surveillance: Secondary | ICD-10-CM

## 2018-11-04 DIAGNOSIS — Z7182 Exercise counseling: Secondary | ICD-10-CM

## 2018-11-04 DIAGNOSIS — R9431 Abnormal electrocardiogram [ECG] [EKG]: Secondary | ICD-10-CM | POA: Diagnosis not present

## 2018-11-04 DIAGNOSIS — Z8249 Family history of ischemic heart disease and other diseases of the circulatory system: Secondary | ICD-10-CM | POA: Diagnosis not present

## 2018-11-04 NOTE — Patient Instructions (Signed)

## 2018-11-04 NOTE — Progress Notes (Signed)
Cardiology Office Note:    Date:  11/04/2018   ID:  [name], DOB [birth year], MRN [id number]  PCP:  Doristine Bosworth, MD  Cardiologist:  No primary care provider on file.  Referring MD: Doristine Bosworth, MD   CC: new patient evaluation for abnormal ECG  History of Present Illness:    [name] is a 53 y.o. female with a hx of fibromyalgia who is seen as a new consult at the request of Doristine Bosworth, MD for the evaluation and management of abnormal ECG.  Per Dr. Creta Levin' note from 10/27/18: "Family history of heart attack and stroke and is s/p multipe CABG surgeries Postmenopausal female with abnormal ecg and history of fibromyalgia Will refer for repeat testing Patient should follow up after Cardiology"  Today: Was told she had an abnormal ECG in Dr. Creta Levin' office, referred for further evaluation.   Cardiovascular risk factors: Prior clinical ASCVD: none Comorbid conditions: No hypertension, hyperlipidemia, diabetes, chronic kidney disease:  Metabolic syndrome/Obesity: BMI 34 Chronic inflammatory conditions: fibromyalgia, no known inflammatory diseases (had many tests done prior to the diagnosis, no other etiologies found) Tobacco use history: former, quit 2009. Smoked intermittently for 15 years.  Family history: father had 5V CABG in early 49s and has since had two strokes (now in his 47s), most recent was a blood clot in the left side of his brain. Griselda Miner had breast cancer, alive at age 86; at age 30 had 5V CABG. Cousin in his early 79s just had MI, this cousin's brother passed from MI in his 44s. No other MI or CVA.  Prior cardiac testing and/or incidental findings: none Exercise level: increasing activity recently, started with a personal trainer last week, has gone back to the gym Current diet: lots of vegetables, nuts. Had been vegan in the past.   No recent labs. No lipids available to me. She is prepping for colonoscopy currently so will hold on labs  today. Would be helpful to have lipid panel for CV risk assessment.   She is very concerned regarding her abnormal ECG. We discussed at length what a normal ECG looks like and how simple things like the heart's position in the chest can change the axis.  We discussed CV risk factors and management/guidelines as below.  ROS positive for anxiety, MSK pain diffusely, hot flashes. ROS negative for chest pain, shortness of breath at rest or with normal exertion. No PND, orthopnea, LE edema or unexpected weight gain. No syncope or palpitations.   Past Medical History:  Diagnosis Date  . Fibromyalgia     No past surgical history on file.  Current Medications: Current Outpatient Medications on File Prior to Visit  Medication Sig  . ondansetron (ZOFRAN) 4 MG tablet Take 1 tablet (4 mg total) by mouth every 8 (eight) hours as needed for nausea or vomiting.  . venlafaxine XR (EFFEXOR-XR) 75 MG 24 hr capsule Take 75 mg by mouth daily.   No current facility-administered medications on file prior to visit.      Allergies:   Penicillin g   Social History   Tobacco Use  . Smoking status: Former Games developer  . Smokeless tobacco: Never Used  Substance Use Topics  . Alcohol use: Yes    Comment: occassional  . Drug use: No    Family History: father had 5V CABG in early 27s and has since had two strokes (now in his 22s), most recent was a blood clot in the left side of his brain.  Teena Irani had breast cancer, alive at age 32; at age 59 had 5V CABG. Cousin in his early 41s just had MI, this cousin's brother passed from MI in his 41s. No other MI or CVA.   ROS:   Please see the history of present illness.  Additional pertinent ROS: Constitutional: Negative for chills, fever, night sweats, unintentional weight loss  HENT: Negative for ear pain and hearing loss.   Eyes: Negative for loss of vision and eye pain.  Respiratory: Negative for cough, sputum, wheezing.   Cardiovascular: See HPI.  Gastrointestinal: Negative for abdominal pain, melena, and hematochezia.  Genitourinary: Negative for dysuria and hematuria.  Musculoskeletal: Negative for falls and myalgias.  Skin: Negative for itching and rash.  Neurological: Negative for focal weakness, focal sensory changes and loss of consciousness.  Endo/Heme/Allergies: Does not bruise/bleed easily.     EKGs/Labs/Other Studies Reviewed:    The following studies were reviewed today: Prior notes/ECGs  EKG:  EKG is personally reviewed.  The ekg ordered today demonstrates NSR, slight rightward axis  Recent Labs: No results found for requested labs within last 8760 hours.  Recent Lipid Panel No results found for: CHOL, TRIG, HDL, CHOLHDL, VLDL, LDLCALC, LDLDIRECT  Physical Exam:    VS:  BP (!) 142/102   Pulse 79   Temp (!) 97.1 F (36.2 C)   Ht 5\' 6"  (1.676 m)   Wt 211 lb (95.7 kg)   SpO2 95%   BMI 34.06 kg/m     Wt Readings from Last 3 Encounters:  10/27/18 216 lb 3.2 oz (98.1 kg)    GEN: Well nourished, well developed in no acute distress HEENT: Normal, moist mucous membranes NECK: No JVD CARDIAC: regular rhythm, normal S1 and S2, no murmurs, rubs, gallops. PMI is between sternum and Left midclavcular line (slightly right rotated) VASCULAR: Radial and DP pulses 2+ bilaterally. No carotid bruits RESPIRATORY:  Clear to auscultation without rales, wheezing or rhonchi  ABDOMEN: Soft, non-tender, non-distended MUSCULOSKELETAL:  Ambulates independently SKIN: Warm and dry, no edema NEUROLOGIC:  Alert and oriented x 3. No focal neuro deficits noted. PSYCHIATRIC:  Normal affect    ASSESSMENT:    1. Abnormal EKG   2. Family history of heart disease   3. Cardiac risk counseling   4. Counseling on health promotion and disease prevention   5. Nutritional counseling   6. Exercise counseling    PLAN:    Abnormal ECG: slight rightward axis, borderline R wave transition point. This is consistent with her physical exam,  which notes that her PMI is just slightly more right rotated. We discussed at length today that ECG is a vector function of electrical signals, used models to demonstrate how rotation can affect direction of electrical signals. She was reassured by this.  Cardiac risk counseling and prevention recommendations: family history of ASCVD -recommend heart healthy/Mediterranean diet, with whole grains, fruits, vegetable, fish, lean meats, nuts, and olive oil. Limit salt. -recommend moderate walking, 3-5 times/week for 30-50 minutes each session. Aim for at least 150 minutes.week. Goal should be pace of 3 miles/hours, or walking 1.5 miles in 30 minutes -recommend avoidance of tobacco products. Avoid excess alcohol. -Additional risk factor control:  -Diabetes: A1c is not available, denies history  -Lipids: she is prepping for colonoscopy today. She is supposed to follow up with Dr. Nolon Rod after this. We will order lipids, but she can also have drawn with Dr. Nolon Rod. Will help [place] determine if she needs more aggressive treatment. Prior lipids used for  risk score, below  -Blood pressure control: slightly elevated today but she is stressed and anxious. Continue to monitor  -Weight: BMI 34, working on weight loss -ASCVD risk score: The 10-year ASCVD risk score Denman George(Goff DC Montez HagemanJr., et al., 2013) is: 3.6%   Values used to calculate the score:     Age: [age] years     Sex: Female     Is Non-Hispanic African American: Yes     Diabetic: No     Tobacco smoker: No     Systolic Blood Pressure: 132 mmHg     Is BP treated: No     HDL Cholesterol: 45 MG/DL     Total Cholesterol: 229 MG/DL    Plan for follow up: 1 year or sooner PRN  Medication Adjustments/Labs and Tests Ordered: Current medicines are reviewed at length with the patient today.  Concerns regarding medicines are outlined above.  Orders Placed This Encounter  Procedures  . EKG 12-Lead   No orders of the defined types were placed in this encounter.    Patient Instructions  Medication Instructions:  Your Physician recommend you continue on your current medication as directed.    If you need a refill on your cardiac medications before your next appointment, please call your pharmacy.   Lab work: None  Testing/Procedures: None  Follow-Up: At [facility], you and your health needs are our priority.  As part of our continuing mission to provide you with exceptional heart care, we have created designated Provider Care Teams.  These Care Teams include your primary Cardiologist (physician) and Advanced Practice Providers (APPs -  Physician Assistants and Nurse Practitioners) who all work together to provide you with the care you need, when you need it. You will need a follow up appointment in 1 years.  Please call our office 2 months in advance to schedule this appointment.  You may see Dr. Cristal Deerhristopher or one of the following Advanced Practice Providers on your designated Care Team:   Theodore DemarkRhonda Barrett, PA-C . Joni ReiningKathryn Lawrence, DNP, ANP      Signed, Jodelle RedBridgette Koralyn Prestage, MD PhD 11/04/2018 5:57 PM    [facility]

## 2018-11-07 ENCOUNTER — Encounter: Payer: Self-pay | Admitting: Cardiology

## 2018-11-08 ENCOUNTER — Encounter (HOSPITAL_COMMUNITY): Payer: Self-pay | Admitting: Family Medicine

## 2018-11-15 ENCOUNTER — Telehealth (HOSPITAL_COMMUNITY): Payer: Self-pay

## 2018-11-15 NOTE — Telephone Encounter (Signed)
New message   Just an FYI. We have made several attempts to contact this patient including sending a letter to schedule or reschedule their echocardiogram. We will be removing the patient from the echo WQ.   10.19.20 @ 12:10pm  both # are the same - lm on home vm - Valaria Kohut  10.12.20 mail reminder letter Etola Mull  10.9.20 @ 8:18am lm on home vm  - Kemia Wendel 11/03/18 OK to schedule EVD

## 2019-02-08 ENCOUNTER — Ambulatory Visit (INDEPENDENT_AMBULATORY_CARE_PROVIDER_SITE_OTHER): Payer: Managed Care, Other (non HMO) | Admitting: Family Medicine

## 2019-02-08 ENCOUNTER — Encounter: Payer: Self-pay | Admitting: Family Medicine

## 2019-02-08 ENCOUNTER — Other Ambulatory Visit: Payer: Self-pay

## 2019-02-08 VITALS — BP 134/76 | HR 64 | Temp 98.3°F | Resp 17 | Ht 66.0 in | Wt 215.8 lb

## 2019-02-08 DIAGNOSIS — R202 Paresthesia of skin: Secondary | ICD-10-CM

## 2019-02-08 DIAGNOSIS — R1909 Other intra-abdominal and pelvic swelling, mass and lump: Secondary | ICD-10-CM | POA: Diagnosis not present

## 2019-02-08 LAB — POCT CBC
Granulocyte percent: 63.4 %G (ref 37–80)
HCT, POC: 44.3 % — AB (ref 29–41)
Hemoglobin: 14.8 g/dL — AB (ref 11–14.6)
Lymph, poc: 2.2 (ref 0.6–3.4)
MCH, POC: 30.6 pg (ref 27–31.2)
MCHC: 33.4 g/dL (ref 31.8–35.4)
MCV: 91.5 fL (ref 76–111)
MID (cbc): 0.3 (ref 0–0.9)
MPV: 7.4 fL (ref 0–99.8)
POC Granulocyte: 4.4 (ref 2–6.9)
POC LYMPH PERCENT: 32.5 %L (ref 10–50)
POC MID %: 4.1 %M (ref 0–12)
Platelet Count, POC: 221 10*3/uL (ref 142–424)
RBC: 4.84 M/uL (ref 4.04–5.48)
RDW, POC: 13.4 %
WBC: 6.9 10*3/uL (ref 4.6–10.2)

## 2019-02-08 NOTE — Patient Instructions (Addendum)
  For the next few days or weeks monitor your symptoms.  If the lesion gets larger return for a recheck.  If normal continue to have the burning sensation, continue to observe for a rash or blistering.  If any of your problems are getting progressively worse let us know.  If the nodule area seems to be getting bigger or more tender in the next few days, I will give you a prescription for an antibiotic, but I do not think it is indicated yet.  It could be a deep labial cyst (Bartholin cyst), but is not quite typical for that.  If you continue to have problems you might need to have an ultrasound done also.  I am inclined to think that something just got a little inflamed and will gradually subside on its own.  Follow-up with me or Dr. Creta Levin if needed.   If you have lab work done today you will be contacted with your lab results within the next 2 weeks.  If you have not heard from [place] then please contact us. The fastest way to get your results is to register for My Chart.   IF you received an x-ray today, you will receive an invoice from [facility]. Please contact [facility] Radiology at [phone] with questions or concerns regarding your invoice.   IF you received labwork today, you will receive an invoice from [facility]. Please contact [facility] at [phone] with questions or concerns regarding your invoice.   Our billing staff will not be able to assist you with questions regarding bills from these companies.  You will be contacted with the lab results as soon as they are available. The fastest way to get your results is to activate your My Chart account. Instructions are located on the last page of this paperwork. If you have not heard from [place] regarding the results in 2 weeks, please contact this office.

## 2019-02-08 NOTE — Progress Notes (Signed)
Patient ID: [name], female    DOB: 1965-06-24  Age: 54 y.o. MRN: [id number]  Chief Complaint  Patient presents with  . bump size of a whole peanut on right side of inner thigh and    ? swollen lymph node, burning and tingling sensation with pain, right side of buttocks and radiating into right leg.  Sunday notice achiness on the right side of buttocks.  Pain level 7/10 , not taking any otc meds for pain.  Pt has hx of fibromyalgia    Subjective:   Patient has been having some concerns about a nodular area in the right groin.  About a month ago she noted after intercourse a peanut (whole, in a shell) sized Nodular area in the right groin between the labia and the leg.  It is a little bit tender.  It seemed to subside and not give any concerns until the last few days.  Again after intercourse she noted the area.  It was smaller than before.  But she also had a burning sensation in the right groin, and from the right lower back/buttock area around and down the leg.  No rash or redness.  Current allergies, medications, problem list, past/family and social histories reviewed.  Objective:  BP 134/76 (BP Location: Right Arm, Patient Position: Sitting, Cuff Size: Normal)   Pulse 64   Temp 98.3 F (36.8 C) (Oral)   Resp 17   Ht 5\' 6"  (1.676 m)   Wt 215 lb 12.8 oz (97.9 kg)   SpO2 100%   BMI 34.83 kg/m  No rash or lesions on her flank or buttock or skin of the medial thigh.  Does not seem to be terribly hypersensitive to touch.  She has a little band of tissue that she pointed out to me palpable on the lateral aspect of the right labia.  Does not feel particularly cystic, more almost fiber0us.   Assessment & Plan:   Assessment: 1. Nodule of groin   2. Paresthesia of right lower extremity       Plan: I explained to her that I felt like this should just be monitored, if anything gets worse needs to be rechecked.  I do not think she is going to get early shingles without burning, but  that would be a possibility.  Maybe this was a deep fibrosed Bartholin's though it is really not particularly typical of anything at this time. Patient ID: [name], female    DOB: 1965-02-14  Age: 54 y.o. MRN: [id number]  Chief Complaint  Patient presents with  . bump size of a whole peanut on right side of inner thigh and    ? swollen lymph node, burning and tingling sensation with pain, right side of buttocks and radiating into right leg.  Sunday notice achiness on the right side of buttocks.  Pain level 7/10 , not taking any otc meds for pain.  Pt has hx of fibromyalgia    Subjective:   See above  Current allergies, medications, problem list, past/family and social histories reviewed.  Objective:  BP 134/76 (BP Location: Right Arm, Patient Position: Sitting, Cuff Size: Normal)   Pulse 64   Temp 98.3 F (36.8 C) (Oral)   Resp 17   Ht 5\' 6"  (1.676 m)   Wt 215 lb 12.8 oz (97.9 kg)   SpO2 100%   BMI 34.83 kg/m   See above  Assessment & Plan:   Assessment: 1. Nodule of groin   2. Paresthesia of right lower  extremity       Plan: See above  Orders Placed This Encounter  Procedures  . POCT CBC    No orders of the defined types were placed in this encounter.        Patient Instructions    For the next few days or weeks monitor your symptoms.  If the lesion gets larger return for a recheck.  If normal continue to have the burning sensation, continue to observe for a rash or blistering.  If any of your problems are getting progressively worse let us know.  If the nodule area seems to be getting bigger or more tender in the next few days, I will give you a prescription for an antibiotic, but I do not think it is indicated yet.  It could be a deep labial cyst (Bartholin cyst), but is not quite typical for that.  If you continue to have problems you might need to have an ultrasound done also.  I am inclined to think that something just got a little inflamed and  will gradually subside on its own.  Follow-up with me or Dr. Creta Levin if needed.   If you have lab work done today you will be contacted with your lab results within the next 2 weeks.  If you have not heard from [place] then please contact us. The fastest way to get your results is to register for My Chart.   IF you received an x-ray today, you will receive an invoice from [facility]. Please contact [facility] at [phone] with questions or concerns regarding your invoice.   IF you received labwork today, you will receive an invoice from [facility]. Please contact [facility] at [phone] with questions or concerns regarding your invoice.   Our billing staff will not be able to assist you with questions regarding bills from these companies.  You will be contacted with the lab results as soon as they are available. The fastest way to get your results is to activate your My Chart account. Instructions are located on the last page of this paperwork. If you have not heard from [place] regarding the results in 2 weeks, please contact this office.        Return if symptoms worsen or fail to improve.   [name], MD 02/08/2019  Orders Placed This Encounter  Procedures  . POCT CBC    No orders of the defined types were placed in this encounter.        Patient Instructions    For the next few days or weeks monitor your symptoms.  If the lesion gets larger return for a recheck.  If normal continue to have the burning sensation, continue to observe for a rash or blistering.  If any of your problems are getting progressively worse let us know.  If the nodule area seems to be getting bigger or more tender in the next few days, I will give you a prescription for an antibiotic, but I do not think it is indicated yet.  It could be a deep labial cyst (Bartholin cyst), but is not quite typical for that.  If you continue to have problems you might need to have an ultrasound  done also.  I am inclined to think that something just got a little inflamed and will gradually subside on its own.  Follow-up with me or Dr. Creta Levin if needed.   If you have lab work done today you will be contacted with your lab results within the next  2 weeks.  If you have not heard from [place] then please contact us. The fastest way to get your results is to register for My Chart.   IF you received an x-ray today, you will receive an invoice from [facility]. Please contact [facility] at [phone] with questions or concerns regarding your invoice.   IF you received labwork today, you will receive an invoice from [place]. Please contact [facility] at [phone] with questions or concerns regarding your invoice.   Our billing staff will not be able to assist you with questions regarding bills from these companies.  You will be contacted with the lab results as soon as they are available. The fastest way to get your results is to activate your My Chart account. Instructions are located on the last page of this paperwork. If you have not heard from [place] regarding the results in 2 weeks, please contact this office.        Return if symptoms worsen or fail to improve. CBC was normal [name], MD 02/08/2019

## 2019-02-09 ENCOUNTER — Ambulatory Visit: Payer: 59 | Admitting: Family Medicine

## 2019-02-15 ENCOUNTER — Other Ambulatory Visit: Payer: Self-pay

## 2019-02-15 ENCOUNTER — Ambulatory Visit (INDEPENDENT_AMBULATORY_CARE_PROVIDER_SITE_OTHER): Payer: Managed Care, Other (non HMO) | Admitting: Adult Health Nurse Practitioner

## 2019-02-15 VITALS — BP 140/80 | HR 69 | Temp 97.8°F | Ht 66.0 in | Wt 211.8 lb

## 2019-02-15 DIAGNOSIS — R1909 Other intra-abdominal and pelvic swelling, mass and lump: Secondary | ICD-10-CM | POA: Insufficient documentation

## 2019-02-15 MED ORDER — DOXYCYCLINE HYCLATE 100 MG PO CAPS: 100.0000 mg | ORAL_CAPSULE | Freq: Two times a day (BID) | ORAL | 0 refills | Status: AC

## 2019-02-15 NOTE — Patient Instructions (Signed)
° ° ° °  If you have lab work done today you will be contacted with your lab results within the next 2 weeks.  If you have not heard from us then please contact us. The fastest way to get your results is to register for My Chart. ° ° °IF you received an x-ray today, you will receive an invoice from [facility]. Please contact [place] [facility] at [phone] with questions or concerns regarding your invoice.  ° °IF you received labwork today, you will receive an invoice from [facility]. Please contact [facility] at [phone] with questions or concerns regarding your invoice.  ° °Our billing staff will not be able to assist you with questions regarding bills from these companies. ° °You will be contacted with the lab results as soon as they are available. The fastest way to get your results is to activate your My Chart account. Instructions are located on the last page of this paperwork. If you have not heard from us regarding the results in 2 weeks, please contact this office. °  ° ° ° °

## 2019-02-15 NOTE — Progress Notes (Signed)
Chief Complaint  Patient presents with  . Follow-up    Lump on the right side of groin and has gotten larger and is painful    HPI   Patient presents to f/u on a bump on her right labia.  Saw Dr. Alwyn Ren in early January.  He told her to monitor it and check a CBC which was normal.  She first noticed it in November.  It was about the size of a peanut.  Then the area subsided somewhat and she ignored it.  It began growing about 1.5 weeks ago and she feels that it is associated with intermittent episodic pain that runs from her left foot into her left thigh down to her knee posteriorly.  The area around her labia that is inflamed is not burning or stinging.  It is sharp.  It has not drained.  It is not warm.  It is not erythematous.  She has never had anything like this in this area.  She does not use any topical estrogen creams.    Problem List    Problem List: 2017-10: Perimenopausal symptoms   Allergies   is allergic to penicillin g.  Medications    Current Outpatient Medications:  .  Black Cohosh 40 MG CAPS, Take by mouth., Disp: , Rfl:  .  Evening Primrose Oil 1000 MG CAPS, Take by mouth., Disp: , Rfl:  .  MELATONIN-GABA-VALERIAN PO, Take by mouth., Disp: , Rfl:  .  Omega 3 1000 MG CAPS, Take by mouth., Disp: , Rfl:  .  ondansetron (ZOFRAN) 4 MG tablet, Take 1 tablet (4 mg total) by mouth every 8 (eight) hours as needed for nausea or vomiting., Disp: 20 tablet, Rfl: 0 .  venlafaxine XR (EFFEXOR-XR) 75 MG 24 hr capsule, Take 75 mg by mouth daily., Disp: , Rfl:  .  vitamin B-12 (CYANOCOBALAMIN) 500 MCG tablet, Take 500 mcg by mouth daily., Disp: , Rfl:    Review of Systems    Constitutional: Negative for activity change, appetite change, chills and fever.  HENT: Negative for congestion, nosebleeds, trouble swallowing and voice change.   Respiratory: Negative for cough, shortness of breath and wheezing.   Gastrointestinal: Negative for diarrhea, nausea and vomiting.    Genitourinary: Negative for difficulty urinating, dysuria, flank pain and hematuria. Positive for lump to right labia  Musculoskeletal: Negative for back pain, joint swelling and neck pain.  Neurological: Negative for dizziness, speech difficulty, light-headedness and numbness.  See HPI. All other review of systems negative.     Physical Exam:   Physical Examination: General appearance - alert, well appearing, and in no distress and oriented to person, place, and time Mental status - normal mood, behavior, speech, dress, motor activity, and thought processes Eyes -EOM, PERRL Neck - supple, n normal pulses in all 4o significant adenopathy, carotids upstroke normal bilaterally, no bruits, thyroid exam: thyroid is normal in size without nodules or tenderness Chest - clear to auscultation, no wheezes, rales or rhonchi, symmetric air entry  Heart - normal rate, regular rhythm, normal S1, S2, no murmurs, rubs, clicks or gallops Extremities - dependent LE edema without clubbing or cyanosis Skin - normal coloration and turgor, no rashes, no suspicious skin lesions noted  Patient is now in general.  urrent hematomas noted  Pelvic exam: normal external genitalia, vulva, vagina, cervix, uterus and adnexa, VULVA: normal appearing vulva with no masses, tenderness or lesions, vulvar nodule about 1.5 centeimeters deep to pelvic bone on the right.  Mild tenderness, fluctuance, movable.  Not fixed.  SKIN:  No hyperpigmentation.    Lab Review   CBC done 02/08/2019 was normal.   Assessment & Plan:   1. Nodule of groin    Meds ordered this encounter  Medications  . doxycycline (VIBRAMYCIN) 100 MG capsule    Sig: Take 1 capsule (100 mg total) by mouth 2 (two) times daily for 10 days.    Dispense:  20 capsule    Refill:  0   At this point, I am not sure what the nodule's etiology is.  However, it could be a foliicuitis, abscess, or dermal cyst.  IT is not warm, draining, or withcausing any  genitourinary problems.  Will see if Doxy is successful in treating this nodule.  If not, would send to GYN for further evaluation.  She is in line with this plan.    Glyn Ade, NP

## 2019-02-18 ENCOUNTER — Encounter: Payer: Self-pay | Admitting: Adult Health Nurse Practitioner

## 2019-09-26 ENCOUNTER — Telehealth: Payer: Self-pay | Admitting: Cardiology

## 2019-09-26 NOTE — Telephone Encounter (Signed)
lvm for patient to return call to get follow up scheduled with [facility] from recall list 

## 2019-11-14 ENCOUNTER — Ambulatory Visit: Payer: Self-pay | Admitting: Cardiology

## 2019-11-25 ENCOUNTER — Encounter: Payer: Self-pay | Admitting: Cardiology

## 2020-07-14 ENCOUNTER — Emergency Department (HOSPITAL_COMMUNITY)
Admission: EM | Admit: 2020-07-14 | Discharge: 2020-07-14 | Disposition: A | Discharge status: Home / Self Care | Payer: Self-pay | Attending: Emergency Medicine | Admitting: Emergency Medicine

## 2020-07-14 ENCOUNTER — Emergency Department (HOSPITAL_COMMUNITY): Payer: Self-pay

## 2020-07-14 DIAGNOSIS — S9002XA Contusion of left ankle, initial encounter: Secondary | ICD-10-CM | POA: Insufficient documentation

## 2020-07-14 DIAGNOSIS — S9032XA Contusion of left foot, initial encounter: Secondary | ICD-10-CM | POA: Insufficient documentation

## 2020-07-14 DIAGNOSIS — Z87891 Personal history of nicotine dependence: Secondary | ICD-10-CM | POA: Insufficient documentation

## 2020-07-14 DIAGNOSIS — W208XXA Other cause of strike by thrown, projected or falling object, initial encounter: Secondary | ICD-10-CM | POA: Insufficient documentation

## 2020-07-14 NOTE — ED Provider Notes (Signed)
[facility]-EMERGENCY DEPT Provider Note   CSN: [id number] Arrival date & time: 07/14/20  1248     History Chief Complaint  Patient presents with   Foot Injury    [name] is a 55 y.o. female.  55 year old female presents with complaint of pain in her left foot and ankle.  Patient states that a microwave fell on her foot today.  Patient is able to bear weight with pain, took Aleve prior to coming in today.  Reports prior surgery to her first metatarsal.  No other injuries, complaints, concerns.  Not on blood thinners.      Past Medical History:  Diagnosis Date   Fibromyalgia     Patient Active Problem List   Diagnosis Date Noted   Nodule of groin 02/15/2019   Perimenopausal symptoms 10/31/2015    No past surgical history on file.   OB History   No obstetric history on file.     No family history on file.  Social History   Tobacco Use   Smoking status: Former    Pack years: 0.00   Smokeless tobacco: Never  Substance Use Topics   Alcohol use: Yes    Comment: occassional   Drug use: No    Home Medications Prior to Admission medications   Medication Sig Start Date End Date Taking? Authorizing Provider  Black Cohosh 40 MG CAPS Take by mouth.    [provider]  Evening Primrose Oil 1000 MG CAPS Take by mouth.    [provider]  MELATONIN-GABA-VALERIAN PO Take by mouth.    [provider]  Omega 3 1000 MG CAPS Take by mouth.    [provider]  ondansetron (ZOFRAN) 4 MG tablet Take 1 tablet (4 mg total) by mouth every 8 (eight) hours as needed for nausea or vomiting. 02/03/18   Hyatt, Max T, DPM  venlafaxine XR (EFFEXOR-XR) 75 MG 24 hr capsule Take 75 mg by mouth daily. 09/02/18   [provider]  vitamin B-12 (CYANOCOBALAMIN) 500 MCG tablet Take 500 mcg by mouth daily.    [provider]    Allergies    Penicillin g  Review of Systems   Review of Systems  Constitutional:   Negative for fever.  Musculoskeletal:  Positive for arthralgias, gait problem and joint swelling.  Skin:  Negative for rash and wound.  Allergic/Immunologic: Negative for immunocompromised state.  Neurological:  Negative for weakness and numbness.  Hematological:  Does not bruise/bleed easily.  Psychiatric/Behavioral:  Negative for confusion.   All other systems reviewed and are negative.  Physical Exam Updated Vital Signs BP (!) 167/93 (BP Location: Left Arm)   Pulse 80   Temp 98 F (36.7 C) (Oral)   Resp 18   SpO2 96%   Physical Exam Vitals and nursing note reviewed.  Constitutional:      General: She is not in acute distress.    Appearance: She is well-developed. She is not diaphoretic.  HENT:     Head: Normocephalic and atraumatic.  Cardiovascular:     Pulses: Normal pulses.  Pulmonary:     Effort: Pulmonary effort is normal.  Musculoskeletal:        General: Swelling, tenderness and signs of injury present. No deformity.     Left ankle: Swelling and ecchymosis present. Tenderness present over the lateral malleolus. No base of 5th metatarsal or proximal fibula tenderness. Decreased range of motion. Normal pulse.  Skin:    General: Skin is warm and dry.  Findings: Bruising present.  Neurological:     Mental Status: She is alert and oriented to person, place, and time.     Sensory: No sensory deficit.     Motor: No weakness.  Psychiatric:        Behavior: Behavior normal.    ED Results / Procedures / Treatments   Labs (all labs ordered are listed, but only abnormal results are displayed) Labs Reviewed - No data to display  EKG None  Radiology DG Ankle Complete Left  Result Date: 07/14/2020 CLINICAL DATA:  Injury EXAM: LEFT ANKLE COMPLETE - 3+ VIEW COMPARISON:  None FINDINGS: Normal ankle joint space. No fracture. Ventral soft tissue swelling. IMPRESSION: Ventral soft tissue swelling.  No fracture. Electronically Signed   By: Marlan Palau M.D.   On:  07/14/2020 13:41   DG Foot Complete Left  Result Date: 07/14/2020 CLINICAL DATA:  Dorsal foot pain after microwave falling on the foot. EXAM: LEFT FOOT - COMPLETE 3+ VIEW COMPARISON:  Foot radiograph 05/04/2018 FINDINGS: Normal anatomic alignment. No evidence for acute fracture or dislocation. Hardware at the first MTP joint. Regional soft tissues unremarkable. IMPRESSION: No acute osseous abnormality. Electronically Signed   By: Annia Belt M.D.   On: 07/14/2020 13:41    Procedures Procedures   Medications Ordered in ED Medications - No data to display  ED Course  I have reviewed the triage vital signs and the nursing notes.  Pertinent labs & imaging results that were available during my care of the patient were reviewed by me and considered in my medical decision making (see chart for details).  Clinical Course as of 07/14/20 1349  Sat Jul 14, 2020  2176 54 year old female with left foot injury after dropping hematocrit on her foot/ankle earlier today.  Found to have bruising swelling and tenderness to the lateral malleolus and lateral foot.  DP pulse present, sensation intact with brisk capillary refill to each digit. X-ray foot and ankle negative for bony injury. [name] is for Ace wrap with crutches, weight-bear as tolerated, ice and elevate, Motrin or Aleve as needed for pain as directed and recheck with PCP in 1 week if not improving. [LM]    Clinical Course User Index [LM] Alden Hipp   MDM Rules/Calculators/A&P                           Final Clinical Impression(s) / ED Diagnoses Final diagnoses:  Contusion of left foot, initial encounter    Rx / DC Orders ED Discharge Orders     None        Jeannie Fend, PA-C 07/14/20 1349    Bethann Berkshire, MD 07/16/20 [phone]

## 2020-07-14 NOTE — ED Provider Notes (Signed)
Emergency Medicine Provider Triage Evaluation Note  [name] , a 55 y.o. female  was evaluated in triage.  Pt complains of left foot pain, microwave fell on foot today, can bear weight but with pain. Prior foot surgery.  Review of Systems  Positive: Foot pain, swelling, bruising Negative: Not on blood thinners   Physical Exam  There were no vitals taken for this visit. Gen:   Awake, no distress   Resp:  Normal effort  MSK:   Moves extremities without difficulty  Other:  Swelling and bruising to lateral left ankle  Medical Decision Making  Medically screening exam initiated at 12:55 PM.  Appropriate orders placed.  Leanette Kouns was informed that the remainder of the evaluation will be completed by another provider, this initial triage assessment does not replace that evaluation, and the importance of remaining in the ED until their evaluation is complete.     Jeannie Fend, PA-C 07/14/20 1258    Bethann Berkshire, MD 07/16/20 [phone]

## 2020-07-14 NOTE — ED Triage Notes (Signed)
Pt complains of left foot pain since a fan fell on it today.

## 2020-07-14 NOTE — Progress Notes (Signed)
Orthopedic Tech Progress Note Patient Details:  [name] 18-Oct-1965 [id number]   Ortho Devices Type of Ortho Device: Crutches, Other (comment) (ACE wrap 3") Ortho Device/Splint Location: LLE Ortho Device/Splint Interventions: Adjustment, Application, Ordered   Post Interventions Patient Tolerated: Well, Ambulated well Instructions Provided: Care of device, Adjustment of device, Poper ambulation with device  Docia Furl 07/14/2020, 2:41 PM

## 2020-07-14 NOTE — Discharge Instructions (Addendum)
Your x-rays today do not show any broken bones. You may weight bear as tolerated on your foot. Apply ice and elevate to help with pain and swelling. Take Aleve or Motrin for pain and swelling. Recheck with your doctor in 1 week if not improving.

## 2021-01-04 DIAGNOSIS — F4323 Adjustment disorder with mixed anxiety and depressed mood: Secondary | ICD-10-CM | POA: Diagnosis not present

## 2021-01-04 DIAGNOSIS — T7431XA Adult psychological abuse, confirmed, initial encounter: Secondary | ICD-10-CM | POA: Diagnosis not present

## 2021-01-17 ENCOUNTER — Other Ambulatory Visit: Payer: Self-pay

## 2021-01-18 ENCOUNTER — Encounter: Payer: Self-pay | Admitting: Family Medicine

## 2021-01-18 ENCOUNTER — Ambulatory Visit (INDEPENDENT_AMBULATORY_CARE_PROVIDER_SITE_OTHER): Payer: BC Managed Care – PPO | Admitting: Family Medicine

## 2021-01-18 VITALS — BP 126/74 | HR 84 | Temp 97.7°F | Ht 67.0 in | Wt 223.4 lb

## 2021-01-18 DIAGNOSIS — E669 Obesity, unspecified: Secondary | ICD-10-CM | POA: Insufficient documentation

## 2021-01-18 DIAGNOSIS — Z1231 Encounter for screening mammogram for malignant neoplasm of breast: Secondary | ICD-10-CM

## 2021-01-18 DIAGNOSIS — Z13220 Encounter for screening for lipoid disorders: Secondary | ICD-10-CM

## 2021-01-18 DIAGNOSIS — F411 Generalized anxiety disorder: Secondary | ICD-10-CM | POA: Diagnosis not present

## 2021-01-18 DIAGNOSIS — R159 Full incontinence of feces: Secondary | ICD-10-CM

## 2021-01-18 DIAGNOSIS — Z1159 Encounter for screening for other viral diseases: Secondary | ICD-10-CM

## 2021-01-18 DIAGNOSIS — R152 Fecal urgency: Secondary | ICD-10-CM

## 2021-01-18 DIAGNOSIS — F419 Anxiety disorder, unspecified: Secondary | ICD-10-CM | POA: Insufficient documentation

## 2021-01-18 DIAGNOSIS — Z6834 Body mass index (BMI) 34.0-34.9, adult: Secondary | ICD-10-CM | POA: Insufficient documentation

## 2021-01-18 LAB — LIPID PANEL
Cholesterol: 232 mg/dL — ABNORMAL HIGH (ref 0–200)
HDL: 49.5 mg/dL (ref >39.00)
LDL Cholesterol: 156 mg/dL — ABNORMAL HIGH (ref 0–99)
NonHDL: 182.44
Total CHOL/HDL Ratio: 5
Triglycerides: 134 mg/dL (ref 0.0–149.0)
VLDL: 26.8 mg/dL (ref 0.0–40.0)

## 2021-01-18 MED ORDER — VENLAFAXINE HCL ER 37.5 MG PO CP24: 37.5000 mg | ORAL_CAPSULE | Freq: Every day | ORAL | 2 refills | Status: DC
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
## 2021-01-19 LAB — HCV AB W REFLEX TO QUANT PCR: HCV Ab: <0.1 s/co ratio (ref 0.0–0.9)

## 2021-01-19 LAB — HCV INTERPRETATION

## 2021-02-19 DIAGNOSIS — T7431XA Adult psychological abuse, confirmed, initial encounter: Secondary | ICD-10-CM | POA: Diagnosis not present

## 2021-02-19 DIAGNOSIS — F4323 Adjustment disorder with mixed anxiety and depressed mood: Secondary | ICD-10-CM | POA: Diagnosis not present

## 2021-02-27 ENCOUNTER — Telehealth: Payer: Self-pay | Admitting: Family Medicine

## 2021-02-27 NOTE — Telephone Encounter (Signed)
Noted. Dm/cma  

## 2021-02-27 NOTE — Telephone Encounter (Signed)
Pt had to cancel appt 2/3 due to other obligations. She is rescheduled for 04/19/21. She thinks she may have 1 refill left on Venlafaxine. She said she is doing well on it and will call if she is needing another refill before 3/24 appt.

## 2021-03-01 ENCOUNTER — Ambulatory Visit: Payer: BC Managed Care – PPO | Admitting: Family Medicine

## 2021-03-15 DIAGNOSIS — T7431XA Adult psychological abuse, confirmed, initial encounter: Secondary | ICD-10-CM | POA: Diagnosis not present

## 2021-03-15 DIAGNOSIS — F4323 Adjustment disorder with mixed anxiety and depressed mood: Secondary | ICD-10-CM | POA: Diagnosis not present

## 2021-04-19 ENCOUNTER — Telehealth: Payer: Self-pay | Admitting: Family Medicine

## 2021-04-19 ENCOUNTER — Ambulatory Visit: Payer: BC Managed Care – PPO | Admitting: Family Medicine

## 2021-04-19 DIAGNOSIS — F411 Generalized anxiety disorder: Secondary | ICD-10-CM

## 2021-04-19 MED ORDER — VENLAFAXINE HCL ER 37.5 MG PO CP24: 37.5000 mg | ORAL_CAPSULE | Freq: Every day | ORAL | 0 refills | Status: DC

## 2021-04-19 NOTE — Telephone Encounter (Signed)
Patient canceled appointment for today and rescheduled for 05/24/21.  He wants a refill on the Venlafaxine.  ?Please review and advise.  ?Thanks. Dm/cma ? ?

## 2021-04-19 NOTE — Telephone Encounter (Signed)
Caller Name: Monee Dembeck ?Call back phone #: [phone] ? ?MEDICATION(S): venlafaxine XR (EFFEXOR XR) 37.5 MG 24 hr capsule ? ? ?Days of Med Remaining: 5 pills ? ?Has the patient contacted their pharmacy (YES/NO)?  Yes, she said she was told to call us ?IF YES, when and what did the pharmacy advise?  ?IF NO, request that the patient contact the pharmacy for the refills in the future.  ?           The pharmacy will send an electronic request (except for controlled medications). ? ?Preferred Pharmacy: [facility] on [address] ? ?~~~Please advise patient/caregiver to allow 2-3 business days to process RX refills.  ?

## 2021-04-22 NOTE — Telephone Encounter (Signed)
Lft VM that RX was sent to get her to her appointment. Dm/cma ? ?

## 2021-05-07 ENCOUNTER — Encounter: Payer: Self-pay | Admitting: Family Medicine

## 2021-05-24 ENCOUNTER — Ambulatory Visit (INDEPENDENT_AMBULATORY_CARE_PROVIDER_SITE_OTHER): Payer: BC Managed Care – PPO | Admitting: Family Medicine

## 2021-05-24 ENCOUNTER — Encounter: Payer: Self-pay | Admitting: Family Medicine

## 2021-05-24 VITALS — BP 136/84 | HR 100 | Temp 97.4°F | Ht 67.0 in | Wt 209.4 lb

## 2021-05-24 DIAGNOSIS — F411 Generalized anxiety disorder: Secondary | ICD-10-CM | POA: Diagnosis not present

## 2021-05-24 DIAGNOSIS — R159 Full incontinence of feces: Secondary | ICD-10-CM | POA: Diagnosis not present

## 2021-05-24 DIAGNOSIS — E669 Obesity, unspecified: Secondary | ICD-10-CM | POA: Diagnosis not present

## 2021-05-24 DIAGNOSIS — Z1231 Encounter for screening mammogram for malignant neoplasm of breast: Secondary | ICD-10-CM | POA: Diagnosis not present

## 2021-05-24 DIAGNOSIS — R152 Fecal urgency: Secondary | ICD-10-CM

## 2021-05-24 MED ORDER — VENLAFAXINE HCL ER 75 MG PO CP24: 75.0000 mg | ORAL_CAPSULE | Freq: Every day | ORAL | 1 refills | Status: DC

## 2021-05-24 NOTE — Progress Notes (Signed)
?[facility] ?[place] ?[place] [address] ?Dept: [phone] ?Dept Fax: [phone] ? ?Chronic Care Office Visit ? ?Subjective:  ? ? Patient ID: [name], female    DOB: 1965/05/30, 56 y.o..   MRN: [id number] ? ?Chief Complaint  ?Patient presents with  ? Follow-up  ?  6 week f/u on anxiety.   ? ? ?History of Present Illness: ? ?Patient is in today for reassessment of chronic medical issues. ? ?[name] has a history of anxiety. She does currently use evening primrose oil to help with her anxiety. At her last visit, we started her on Effexor. She has noted some improvements, but not as much as she might hope. She does admit that there have been stressors in her marriage, though she feels some of this is doing better at this point. ?  ?Also, at her last visit, [name] noted a 57-months of fecal urgency and occasional anal leakage. I had referred her to GI, but she admits she has not followed through with that appointment as of yet. She plans to do so soon. ? ?Past Medical History: ?Patient Active Problem List  ? Diagnosis Date Noted  ? BMI 34.0-34.9,adult 01/18/2021  ? Anxiety disorder 01/18/2021  ? Perimenopausal symptoms 10/31/2015  ? ?Past Surgical History:  ?Procedure Laterality Date  ? CESAREAN SECTION    ? FOOT SURGERY Bilateral   ? 1st MTP Joint  ? ORIF ELBOW FRACTURE Right   ? ORIF FINGER / THUMB FRACTURE Right   ? Thumb  ? ?Family History  ?Problem Relation Age of Onset  ? COPD Mother   ? Heart disease Father   ? Diabetes Father   ? Hypertension Father   ? Stroke Father   ? Cancer Paternal Grandmother   ?     Breast  ? ?Outpatient Medications Prior to Visit  ?Medication Sig Dispense Refill  ? Coenzyme Q10 (COQ10 PO) Take by mouth.    ? Evening Primrose Oil 1000 MG CAPS Take by mouth.    ? MELATONIN-GABA-VALERIAN PO Take by mouth.    ? Omega 3 1000 MG CAPS Take by mouth.    ? Turmeric (QC TUMERIC COMPLEX PO) Take by mouth.    ? vitamin  B-12 (CYANOCOBALAMIN) 500 MCG tablet Take 500 mcg by mouth daily.    ? venlafaxine XR (EFFEXOR XR) 37.5 MG 24 hr capsule Take 1 capsule (37.5 mg total) by mouth daily with breakfast. 30 capsule 0  ? ?No facility-administered medications prior to visit.  ? ?Allergies  ?Allergen Reactions  ? Penicillin G Rash  ?  ?Objective:  ? ?Today's Vitals  ? 05/24/21 1547  ?BP: 136/84  ?Pulse: 100  ?Temp: (!) 97.4 ?F (36.3 ?C)  ?TempSrc: Temporal  ?SpO2: 98%  ?Weight: 209 lb 6.4 oz (95 kg)  ?Height: 5\' 7"  (1.702 m)  ? ?Body mass index is 32.8 kg/m?.  ? ?General: Well developed, well nourished. No acute distress. ?Psych: Alert and oriented. Normal mood and affect. ? ?Health Maintenance Due  ?Topic Date Due  ? Zoster Vaccines- Shingrix (1 of 2) Never done  ? MAMMOGRAM  08/23/2020  ?   ?Assessment & Plan:  ? ?1. Generalized anxiety disorder ?[name] has had a partial response to her Effexor. We will try increasing her dose of Effexor to see if her anxiety improves further. I will see her back in 6 weeks. ? ?- venlafaxine XR (EFFEXOR XR) 75 MG 24 hr capsule; Take 1 capsule (75 mg  total) by mouth daily with breakfast.  Dispense: 90 capsule; Refill: 1 ? ?2. Incontinence of feces with fecal urgency ?[name] assures me she will reach out to schedule with GI> ? ?3. Obesity (BMI 30.0-34.9) ?[name]'s weight is down 16 lbs. She notes she has focused on clean eating, with a mostly vegetarian approach. She also is trying to remain active. ? ?4. Encounter for screening mammogram for malignant neoplasm of breast ? ?- MM DIGITAL SCREENING BILATERAL; Future ? ?Return in about 6 weeks (around 07/05/2021) for Annual preventative care.  ? ?Haydee Salter, MD ?

## 2021-05-30 ENCOUNTER — Telehealth: Payer: Self-pay | Admitting: Internal Medicine

## 2021-05-30 NOTE — Telephone Encounter (Signed)
Good afternoon Dr. Leonides Schanz, ? ?This patient is requesting a transfer of care to you.  She saw Dr. Loreta Ave only one time for a procedure 11/05/18 for a colonoscopy and hasn't seen her since then.  (Report in Epic)  Her PCP is referring her to this practice because she is having fecal incontinence.   ? ?Please let me know if you approve her transfer. ? ?Thank you. ?

## 2021-06-07 ENCOUNTER — Ambulatory Visit
Admission: RE | Admit: 2021-06-07 | Discharge: 2021-06-07 | Disposition: A | Discharge status: Home / Self Care | Payer: BC Managed Care – PPO | Source: Ambulatory Visit | Attending: Family Medicine | Admitting: Family Medicine

## 2021-06-07 DIAGNOSIS — Z1231 Encounter for screening mammogram for malignant neoplasm of breast: Secondary | ICD-10-CM

## 2021-06-08 DIAGNOSIS — H40053 Ocular hypertension, bilateral: Secondary | ICD-10-CM | POA: Diagnosis not present

## 2021-07-12 ENCOUNTER — Ambulatory Visit: Payer: BC Managed Care – PPO | Admitting: Family Medicine

## 2021-07-16 ENCOUNTER — Ambulatory Visit (INDEPENDENT_AMBULATORY_CARE_PROVIDER_SITE_OTHER): Payer: BC Managed Care – PPO | Admitting: Family Medicine

## 2021-07-16 VITALS — BP 144/88 | HR 91 | Temp 97.1°F | Ht 67.0 in | Wt 206.2 lb

## 2021-07-16 DIAGNOSIS — R030 Elevated blood-pressure reading, without diagnosis of hypertension: Secondary | ICD-10-CM

## 2021-07-16 DIAGNOSIS — Z23 Encounter for immunization: Secondary | ICD-10-CM | POA: Diagnosis not present

## 2021-07-16 DIAGNOSIS — F411 Generalized anxiety disorder: Secondary | ICD-10-CM | POA: Diagnosis not present

## 2021-07-16 DIAGNOSIS — L858 Other specified epidermal thickening: Secondary | ICD-10-CM

## 2021-07-16 DIAGNOSIS — Z0000 Encounter for general adult medical examination without abnormal findings: Secondary | ICD-10-CM | POA: Diagnosis not present

## 2021-07-16 DIAGNOSIS — I10 Essential (primary) hypertension: Secondary | ICD-10-CM | POA: Insufficient documentation

## 2021-07-16 DIAGNOSIS — E669 Obesity, unspecified: Secondary | ICD-10-CM

## 2021-07-16 DIAGNOSIS — E785 Hyperlipidemia, unspecified: Secondary | ICD-10-CM

## 2021-07-16 NOTE — Progress Notes (Signed)
[facility] [address] [place] [address] Dept: [phone] Dept Fax: [phone]  Annual Physical Visit  Subjective:    Patient ID: [name], female    DOB: 1965-04-01, 56 y.o..   MRN: [id number]  Chief Complaint  Patient presents with   Annual Exam    CPE/lab.  No concerns. Not fasting today.      History of Present Illness:  Patient is in today for an annual physical/preventative visit.  Review of Systems  Constitutional:  Positive for weight loss. Negative for chills, fever and malaise/fatigue.       Actively working at [employer] loss with improved diet ("clean eating"), regular walkign and exercise. Weight is down 17 lbs since Dec.   HENT:  Negative for congestion, ear discharge, ear pain, hearing loss, sinus pain and sore throat.   Eyes:  Negative for blurred vision, pain, discharge and redness.  Respiratory:  Negative for cough, shortness of breath and wheezing.   Cardiovascular:  Negative for chest pain, palpitations and leg swelling.  Gastrointestinal:  Negative for abdominal pain, constipation, diarrhea, heartburn, nausea and vomiting.  Musculoskeletal:  Negative for back pain, joint pain and neck pain.  Skin:  Positive for rash.       Notes periodic flare of rash on out aspect of upper arm and shoulder.  Psychiatric/Behavioral:  Negative for depression. The patient is nervous/anxious.        History of anxiety. This is improved with use of venlafaxine.   Past Medical History: Patient Active Problem List   Diagnosis Date Noted   Elevated blood-pressure reading without diagnosis of hypertension 07/16/2021   Obesity (BMI 30.0-34.9) 01/18/2021   Anxiety disorder 01/18/2021   Perimenopausal symptoms 10/31/2015   Past Surgical History:  Procedure Laterality Date   CESAREAN SECTION     FOOT SURGERY Bilateral    1st MTP Joint   ORIF ELBOW FRACTURE Right    ORIF FINGER / THUMB FRACTURE Right    Thumb    Family History  Problem Relation Age of Onset   COPD Mother    Heart disease Father    Diabetes Father    Hypertension Father    Stroke Father    Breast cancer Paternal Grandmother    Cancer Paternal Grandmother        Breast   Outpatient Medications Prior to Visit  Medication Sig Dispense Refill   Coenzyme Q10 (COQ10 PO) Take by mouth.     Evening Primrose Oil 1000 MG CAPS Take by mouth.     MELATONIN-GABA-VALERIAN PO Take by mouth.     Omega 3 1000 MG CAPS Take by mouth.     Turmeric (QC TUMERIC COMPLEX PO) Take by mouth.     venlafaxine XR (EFFEXOR XR) 75 MG 24 hr capsule Take 1 capsule (75 mg total) by mouth daily with breakfast. 90 capsule 1   vitamin B-12 (CYANOCOBALAMIN) 500 MCG tablet Take 500 mcg by mouth daily.     No facility-administered medications prior to visit.   Allergies  Allergen Reactions   Penicillin G Rash      Objective:   Today's Vitals   07/16/21 1547 07/16/21 1549  BP: (!) 144/86 (!) 144/88  Pulse: 91   Temp: (!) 97.1 F (36.2 C)   TempSrc: Temporal   SpO2: 98%   Weight: 206 lb 3.2 oz (93.5 kg)   Height: 5\' 7"  (1.702 m)    Body mass index is 32.3 kg/m.   General: Well developed,  well nourished. No acute distress. HEENT: Normocephalic, non-traumatic. PERRL, EOMI. Conjunctiva clear. Fundiscopic exam shows   normal disc and vasculature. External ears normal. EAC and TMs normal bilaterally. Nose    clear without congestion or rhinorrhea. Mucous membranes moist. Oropharynx clear. Good dentition. Neck: Supple. No lymphadenopathy. No thyromegaly. Lungs: Clear to auscultation bilaterally. No wheezing, rales or rhonchi. CV: RRR without murmurs or rubs. Pulses 2+ bilaterally. Abdomen: Soft, non-tender. Bowel sounds positive, normal pitch and frequency. No   hepatosplenomegaly. No rebound or guarding. Extremities: Full ROM. No joint swelling or tenderness. No edema noted. Skin: Warm and dry. There are small red papular lesions in a follicular  pattern on the lateral aspect of   the upper arms. Psych: Alert and oriented. Normal mood and affect.  Health Maintenance Due  Topic Date Due   Zoster Vaccines- Shingrix (1 of 2) Never done   PAP SMEAR-Modifier  08/23/2021     Assessment & Plan:   1. Annual physical exam Overall excellent health. She is due for follow up with her GYN for her pap smear. Will start shingles series today.  2. Generalized anxiety disorder Stable on venlafaxine 75 mg daily.  3. Obesity (BMI 30.0-34.9) Weight is down 17 lbs since Dec. (Initial weight = 223 lbs). Encouraged her to continue lifestyle changes which are helping her improve her overall heath.  4. Elevated blood-pressure reading without diagnosis of hypertension Blood pressure is elevated today. We discussed her obtaining a home BP monitor and start to check her pressures. If these remain > 140/80, she should follow up to discuss treatment.  5. Borderline hyperlipidemia The 10-year ASCVD risk score (Arnett DK, et al., 2019) is: 7.4%   Values used to calculate the score:     Age: 85 years     Sex: Female     Is Non-Hispanic African American: No     Diabetic: No     Tobacco smoker: Yes     Systolic Blood Pressure: 144 mmHg     Is BP treated: No     HDL Cholesterol: 49.5 mg/dL     Total Cholesterol: 232 mg/dL  We will plan to reassess this in 6 months.  6. Keratosis pilaris Recommend she use a urea-containing lotion to relieve keratin plugs, which should improve the appearance of this rash.  7. Need for shingles vaccine  - Varicella-zoster vaccine IM (Shingrix)  Return in about 6 months (around 01/15/2022) for Reassessment.   Loyola Mast, MD

## 2021-11-05 ENCOUNTER — Telehealth: Payer: Self-pay | Admitting: Family Medicine

## 2021-11-05 NOTE — Telephone Encounter (Signed)
Caller Name: Brenn Gatton Call back phone #: [phone]  Reason for Call: Please call pt regarding her Venlafixine, she is wanting to ask about getting a refill and also increasing the dose due to recent life changes.

## 2021-11-05 NOTE — Telephone Encounter (Signed)
Spoke to patient and she is going through a  separation and is sometimes taking 2 tablets daily of the Venlafaxine 75 mg.  Advised she would need an ppointment and scheduled one for 11/08/21 @9 :00 am.  Dm/cma

## 2021-11-08 ENCOUNTER — Telehealth: Payer: Self-pay | Admitting: Family Medicine

## 2021-11-08 ENCOUNTER — Ambulatory Visit: Payer: BC Managed Care – PPO | Admitting: Family Medicine

## 2021-11-08 DIAGNOSIS — F411 Generalized anxiety disorder: Secondary | ICD-10-CM

## 2021-11-08 MED ORDER — VENLAFAXINE HCL ER 75 MG PO CP24: 75.0000 mg | ORAL_CAPSULE | Freq: Every day | ORAL | 0 refills | Status: DC

## 2021-11-08 NOTE — Telephone Encounter (Signed)
Pt is wanting a cb concerning a medication change. She missed her appointment today, she is very sorry. Please advise pt @ [phone]

## 2021-11-08 NOTE — Telephone Encounter (Signed)
10.13.23 no show letter sent 

## 2021-11-08 NOTE — Telephone Encounter (Signed)
Spoke to patient and she will come in on 12/04/21 @ 3:40 pm. #30 days supply sent to [place].   Dm/cma

## 2021-11-14 ENCOUNTER — Ambulatory Visit (INDEPENDENT_AMBULATORY_CARE_PROVIDER_SITE_OTHER): Payer: BC Managed Care – PPO

## 2021-11-14 DIAGNOSIS — Z23 Encounter for immunization: Secondary | ICD-10-CM

## 2021-11-14 NOTE — Progress Notes (Signed)
After obtaining consent, and per orders of Dr. Clare Charon, injection of Shingles Vaccine given by Augustina Mood. Patient tolerated injection well instructed to remain in clinic for 20 minutes afterwards, and to report any adverse reaction to me immediately.

## 2021-11-19 ENCOUNTER — Encounter: Payer: Self-pay | Admitting: Family Medicine

## 2021-11-19 NOTE — Telephone Encounter (Signed)
04/19/2021 late cancellation 07/12/2021 late cancellation/went the wrong direction 11/08/2021 no show  Final warning letter sent via mail and mychart, fee generated

## 2021-12-04 ENCOUNTER — Ambulatory Visit (INDEPENDENT_AMBULATORY_CARE_PROVIDER_SITE_OTHER): Payer: BC Managed Care – PPO | Admitting: Family Medicine

## 2021-12-04 VITALS — BP 132/84 | HR 85 | Temp 97.9°F | Ht 67.0 in | Wt 206.8 lb

## 2021-12-04 DIAGNOSIS — F411 Generalized anxiety disorder: Secondary | ICD-10-CM

## 2021-12-04 DIAGNOSIS — Z630 Problems in relationship with spouse or partner: Secondary | ICD-10-CM | POA: Diagnosis not present

## 2021-12-04 MED ORDER — VENLAFAXINE HCL ER 150 MG PO CP24: 150.0000 mg | ORAL_CAPSULE | Freq: Every day | ORAL | 5 refills | Status: DC

## 2021-12-04 NOTE — Progress Notes (Signed)
[facility] [address] [place] [address] Dept: [phone] Dept Fax: [phone]  Chronic Care Office Visit  Subjective:    Patient ID: [name], female    DOB: [birth year], 56 y.o..   MRN: [id number]  Chief Complaint  Patient presents with   Follow-up    F/u meds.  C/o having generalized anxiety.      History of Present Illness:  Patient is in today for reassessment of chronic medical issues.  [name] has a history of anxiety. She has been manage don venlafaxine 75 mg daily. She notes he anxiety has been flared recently. She and her husband had taken in a family form [place] for a time, though this did not work out and they are now gone. she also notes she and her husband have separated. She describes issues he has had with anger management, which are triggers for her in light of her past traumas. This is not the first time they have separated and neither is looking to get divorced. She is setting boundaries with her husband regarding acceptable behavior and him taking responsibility for himself. Additionally, he dog recently had a litter of 10 puppies, so she is trying to manage their care and finding them appropriate homes.  All of hese issues have contributed ot her increased stress.  Past Medical History: Patient Active Problem List   Diagnosis Date Noted   Elevated blood-pressure reading without diagnosis of hypertension 07/16/2021   Obesity (BMI 30.0-34.9) 01/18/2021   Anxiety disorder 01/18/2021   Perimenopausal symptoms 10/31/2015   Past Surgical History:  Procedure Laterality Date   CESAREAN SECTION     FOOT SURGERY Bilateral    1st MTP Joint   ORIF ELBOW FRACTURE Right    ORIF FINGER / THUMB FRACTURE Right    Thumb   Family History  Problem Relation Age of Onset   COPD Mother    Heart disease Father    Diabetes Father    Hypertension Father    Stroke Father    Breast cancer Paternal  Grandmother    Cancer Paternal Grandmother        Breast   Outpatient Medications Prior to Visit  Medication Sig Dispense Refill   Coenzyme Q10 (COQ10 PO) Take by mouth.     Evening Primrose Oil 1000 MG CAPS Take by mouth.     lactobacillus acidophilus (BACID) TABS tablet Take 2 tablets by mouth 3 (three) times daily.     MELATONIN-GABA-VALERIAN PO Take by mouth.     Omega 3 1000 MG CAPS Take by mouth.     Turmeric (QC TUMERIC COMPLEX PO) Take by mouth.     vitamin B-12 (CYANOCOBALAMIN) 500 MCG tablet Take 500 mcg by mouth daily.     venlafaxine XR (EFFEXOR XR) 75 MG 24 hr capsule Take 1 capsule (75 mg total) by mouth daily with breakfast. 30 capsule 0   No facility-administered medications prior to visit.   Allergies  Allergen Reactions   Penicillin G Rash     Objective:   Today's Vitals   12/04/21 1545  BP: 132/84  Pulse: 85  Temp: 97.9 F (36.6 C)  TempSrc: Temporal  SpO2: 99%  Weight: 206 lb 12.8 oz (93.8 kg)  Height: 5\' 7"  (1.702 m)   Body mass index is 32.39 kg/m.   General: Well developed, well nourished. No acute distress. Psych: Alert and oriented. Normal mood and affect.  Health Maintenance Due  Topic Date Due   PAP  SMEAR-Modifier  08/23/2021        12/04/2021    4:20 PM 01/18/2021    9:55 AM 02/15/2019    9:06 AM  Depression screen PHQ 2/9  Decreased Interest 1 0 0  Down, Depressed, Hopeless 2 0 0  PHQ - 2 Score 3 0 0  Altered sleeping 2    Tired, decreased energy 1    Change in appetite 0    Feeling bad or failure about yourself  3    Trouble concentrating 1    Moving slowly or fidgety/restless 2    Suicidal thoughts 1    PHQ-9 Score 13    Difficult doing work/chores Somewhat difficult        12/04/2021    4:20 PM 02/15/2019    9:06 AM  GAD 7 : Generalized Anxiety Score  Nervous, Anxious, on Edge 3 0  Control/stop worrying 3 0  Worry too much - different things 3 0  Trouble relaxing 2 0  Restless 1 0  Easily annoyed or irritable 3 0   Afraid - awful might happen 3 0  Total GAD 7 Score 18 0  Anxiety Difficulty Somewhat difficult Not difficult at all   Assessment & Plan:   1. Generalized anxiety disorder [name] appears to be having increased anxiety related to her recent stressors. I spent time counseling her about her focus on her own well being. She is currently on a break form counseling, but plans to resume this. I will increase her Effexor to 150 mg day and plan to reassess her in 6 weeks.  - venlafaxine XR (EFFEXOR-XR) 150 MG 24 hr capsule; Take 1 capsule (150 mg total) by mouth daily with breakfast.  Dispense: 30 capsule; Refill: 5  2. Marital conflict As above. I support her holding her husband accountable for his situation.   Return in about 6 weeks (around 01/15/2022) for Reassessment.   Loyola Mast, MD

## 2022-01-10 ENCOUNTER — Encounter: Payer: Self-pay | Admitting: Family Medicine

## 2022-01-10 ENCOUNTER — Ambulatory Visit (INDEPENDENT_AMBULATORY_CARE_PROVIDER_SITE_OTHER): Payer: BC Managed Care – PPO | Admitting: Family Medicine

## 2022-01-10 VITALS — BP 138/84 | HR 104 | Temp 96.9°F | Ht 67.0 in | Wt 208.4 lb

## 2022-01-10 DIAGNOSIS — F411 Generalized anxiety disorder: Secondary | ICD-10-CM | POA: Diagnosis not present

## 2022-01-10 DIAGNOSIS — I10 Essential (primary) hypertension: Secondary | ICD-10-CM | POA: Diagnosis not present

## 2022-01-10 MED ORDER — AMLODIPINE BESYLATE 5 MG PO TABS: 5.0000 mg | ORAL_TABLET | Freq: Every day | ORAL | 3 refills | Status: DC

## 2022-01-10 NOTE — Progress Notes (Signed)
[facility]ARE [facility] [address] [place] [address] Dept: [phone] Dept Fax: [phone]  Chronic Care Office Visit  Subjective:    Patient ID: [name], female    DOB: May 25, 1965, 56 y.o..   MRN: [id number]  Chief Complaint  Patient presents with   Follow-up    6 week f/u.  No concerns.      History of Present Illness:  Patient is in today for reassessment of chronic medical issues.  Ms. Weger has a history of anxiety. At her last visit, we increased her venlafaxine to 150 mg daily. She feels like this is managing her mood better. She does have ongoing stressors related to separation from her husband, managing with multiple young puppies, and the death of one of her dogs.  Ms. Aron has been noted to have elevated BPs over the past 9 months. She is coming ot terms with likely having chronic hypertension. It does run in her family.   Past Medical History: Patient Active Problem List   Diagnosis Date Noted   Essential hypertension 07/16/2021   Obesity (BMI 30.0-34.9) 01/18/2021   Anxiety disorder 01/18/2021   Perimenopausal symptoms 10/31/2015   Past Surgical History:  Procedure Laterality Date   CESAREAN SECTION     FOOT SURGERY Bilateral    1st MTP Joint   ORIF ELBOW FRACTURE Right    ORIF FINGER / THUMB FRACTURE Right    Thumb   Family History  Problem Relation Age of Onset   COPD Mother    Heart disease Father    Diabetes Father    Hypertension Father    Stroke Father    Breast cancer Paternal Grandmother    Cancer Paternal Grandmother        Breast    Outpatient Medications Prior to Visit  Medication Sig Dispense Refill   Coenzyme Q10 (COQ10 PO) Take by mouth.     Evening Primrose Oil 1000 MG CAPS Take by mouth.     lactobacillus acidophilus (BACID) TABS tablet Take 2 tablets by mouth 3 (three) times daily.     MELATONIN-GABA-VALERIAN PO Take by mouth.     Omega 3 1000 MG CAPS Take by  mouth.     Turmeric (QC TUMERIC COMPLEX PO) Take by mouth.     venlafaxine XR (EFFEXOR-XR) 150 MG 24 hr capsule Take 1 capsule (150 mg total) by mouth daily with breakfast. 30 capsule 5   vitamin B-12 (CYANOCOBALAMIN) 500 MCG tablet Take 500 mcg by mouth daily.     No facility-administered medications prior to visit.   Allergies  Allergen Reactions   Penicillin G Rash      Objective:   Today's Vitals   01/10/22 1526  BP: 138/84  Pulse: (!) 104  Temp: (!) 96.9 F (36.1 C)  TempSrc: Temporal  SpO2: 96%  Weight: 208 lb 6.4 oz (94.5 kg)  Height: 5\' 7"  (1.702 m)   Body mass index is 32.64 kg/m.   General: Well developed, well nourished. No acute distress. Psych: Alert and oriented. Normal mood and affect.  Health Maintenance Due  Topic Date Due   PAP SMEAR-Modifier  08/23/2021     Assessment & Plan:   1. Generalized anxiety disorder Improved. We did discuss ways she is managing with stressors, including through her strong faith as a Jehovah's Witness. I will continue her on venlafaxine 150 mg twice a day.  2. Essential hypertension Ms. Kubisiak's blood pressures have consistently averaged in a Stage 1 hypertension range.  I reviewed nonpharmacologic approaches to hypertension control. I will go ahead and initiate amlodipine. We will reassess her BP in 6 weeks.  - amLODipine (NORVASC) 5 MG tablet; Take 1 tablet (5 mg total) by mouth daily.  Dispense: 30 tablet; Refill: 3  Return in about 6 weeks (around 02/21/2022) for Reassessment.   Loyola Mast, MD

## 2022-01-10 NOTE — Patient Instructions (Signed)

## 2022-01-16 DIAGNOSIS — Z01419 Encounter for gynecological examination (general) (routine) without abnormal findings: Secondary | ICD-10-CM | POA: Diagnosis not present

## 2022-01-16 DIAGNOSIS — F419 Anxiety disorder, unspecified: Secondary | ICD-10-CM | POA: Diagnosis not present

## 2022-01-16 DIAGNOSIS — I10 Essential (primary) hypertension: Secondary | ICD-10-CM | POA: Diagnosis not present

## 2022-01-17 ENCOUNTER — Other Ambulatory Visit: Payer: Self-pay | Admitting: Obstetrics and Gynecology

## 2022-01-17 DIAGNOSIS — E2839 Other primary ovarian failure: Secondary | ICD-10-CM

## 2022-02-21 NOTE — Progress Notes (Incomplete)
Advanced Hypertension Clinic Initial Assessment:    Date:  02/24/2022   ID:  [name], DOB 1965-04-10, MRN [id number]  PCP:  Haydee Salter, MD  Cardiologist:  None  Nephrologist:  Referring MD: Servando Salina, MD   CC: Hypertension  History of Present Illness:    [name] is a 57 y.o. female with a hx of hypertension, fibromyalgia, here to establish care in the Advanced Hypertension Clinic.   Referral notes from Dr. Garwin Brothers personally reviewed. At their visit 01/16/2022 she was noted to have a history of hypertension on amlodipine 5 mg. Her blood pressure was 116/76.  Previously seen in cardiology by Dr. Harrell Gave 11/04/2018 for abnormal EKG. Her EKG at that visit showed NSR and slight rightward axis, borderline R wave transition point. Her blood pressure was 142/102 not on antihypertensives at that time.  Today,  She denies any palpitations, chest pain, shortness of breath, or peripheral edema. No lightheadedness, headaches, syncope, orthopnea, or PND.  (+)  ***Plan: -  Previous antihypertensives:   Past Medical History:  Diagnosis Date   Fibromyalgia     Past Surgical History:  Procedure Laterality Date   CESAREAN SECTION     FOOT SURGERY Bilateral    1st MTP Joint   ORIF ELBOW FRACTURE Right    ORIF FINGER / THUMB FRACTURE Right    Thumb    Current Medications: No outpatient medications have been marked as taking for the 02/24/22 encounter (Appointment) with Skeet Latch, MD.     Allergies:   Penicillin g   Social History   Socioeconomic History   Marital status: Married    Spouse name: Not on file   Number of children: 2   Years of education: Not on file   Highest education level: Some college, no degree  Occupational History   Occupation: Building services engineer    Comment: [employer]  Tobacco Use   Smoking status: Former   Smokeless tobacco: Never  Scientific laboratory technician Use: Never used  Substance and Sexual  Activity   Alcohol use: Yes    Comment: occassional   Drug use: Not Currently    Types: "Crack" cocaine   Sexual activity: Yes  Other Topics Concern   Not on file  Social History Narrative   Jehovah's Witness   Social Determinants of Health   Financial Resource Strain: Not on file  Food Insecurity: Not on file  Transportation Needs: Not on file  Physical Activity: Not on file  Stress: Not on file  Social Connections: Not on file     Family History: The patient's family history includes Breast cancer in her paternal grandmother; COPD in her mother; Cancer in her paternal grandmother; Diabetes in her father; Heart disease in her father; Hypertension in her father; Stroke in her father.  ROS:   Please see the history of present illness.     All other systems reviewed and are negative.  EKGs/Labs/Other Studies Reviewed:    No prior cardiovascular studies available.  EKG:  EKG is personally reviewed. 02/24/2022: Sinus ***. Rate *** bpm.  Recent Labs: No results found for requested labs within last 365 days.   Recent Lipid Panel    Component Value Date/Time   CHOL 232 (H) 01/18/2021 1050   TRIG 134.0 01/18/2021 1050   HDL 49.50 01/18/2021 1050   CHOLHDL 5 01/18/2021 1050   VLDL 26.8 01/18/2021 1050   LDLCALC 156 (H) 01/18/2021 1050    Physical Exam:    VS:  There  were no vitals taken for this visit. , BMI There is no height or weight on file to calculate BMI. GENERAL:  Well appearing HEENT: Pupils equal round and reactive, fundi not visualized, oral mucosa unremarkable NECK:  No jugular venous distention, waveform within normal limits, carotid upstroke brisk and symmetric, no bruits, no thyromegaly LYMPHATICS:  No cervical adenopathy LUNGS:  Clear to auscultation bilaterally HEART:  RRR.  PMI not displaced or sustained,S1 and S2 within normal limits, no S3, no S4, no clicks, no rubs, *** murmurs ABD:  Flat, positive bowel sounds normal in frequency in pitch, no  bruits, no rebound, no guarding, no midline pulsatile mass, no hepatomegaly, no splenomegaly EXT:  2 plus pulses throughout, no edema, no cyanosis, no clubbing SKIN:  No rashes, no nodules NEURO:  Cranial nerves II through XII grossly intact, motor grossly intact throughout PSYCH:  Cognitively intact, oriented to person place and time   ASSESSMENT/PLAN:    No problem-specific Assessment & Plan notes found for this encounter.   Screening for Secondary Hypertension: { Click here to document screening for secondary causes of HTN  :[id number]}    Relevant Labs/Studies:    Latest Ref Rng & Units 03/29/2011    2:50 AM 07/28/2010    4:45 AM 07/23/2007    5:30 AM  Basic Labs  Sodium 135 - 145 mEq/L 139  139  139   Potassium 3.5 - 5.1 mEq/L 3.5  3.6  3.3   Creatinine 0.50 - 1.10 mg/dL 0.70  0.65  0.69                        she consents to be monitored in our remote patient monitoring program through [place].  she will track his blood pressure twice daily and understands that these trends will help [place] to adjust her medications as needed prior to his next appointment.  she *** interested in enrolling in the PREP exercise and nutrition program through the [facility].     Disposition:   *** FU with APP/PharmD in 1 month for the next 3 months.   FU with Tiffany C. Oval Linsey, MD, [facility] in 4 months.  Medication Adjustments/Labs and Tests Ordered: Current medicines are reviewed at length with the patient today.  Concerns regarding medicines are outlined above.   No orders of the defined types were placed in this encounter.  No orders of the defined types were placed in this encounter.  I,Mathew Stumpf,acting as a Education administrator for Skeet Latch, MD.,have documented all relevant documentation on the behalf of Skeet Latch, MD,as directed by  Skeet Latch, MD while in the presence of Skeet Latch, MD.  ***  Signed, Madelin Rear  02/24/2022 1:25 PM    [facility]

## 2022-02-24 ENCOUNTER — Ambulatory Visit (HOSPITAL_BASED_OUTPATIENT_CLINIC_OR_DEPARTMENT_OTHER): Payer: BC Managed Care – PPO | Admitting: Cardiovascular Disease

## 2022-03-31 NOTE — Progress Notes (Incomplete)
Advanced Hypertension Clinic Initial Assessment:    Date:  03/31/2022   ID:  [name], DOB [birth year], MRN [id number]  PCP:  Haydee Salter, MD  Cardiologist:  None  Nephrologist:  Referring MD: Servando Salina, MD   CC: Hypertension  History of Present Illness:    [name] is a 57 y.o. female with a hx of hypertension, *** here to establish care in the Advanced Hypertension Clinic.   Today,  *** denies any palpitations, chest pain, shortness of breath, or peripheral edema. No lightheadedness, headaches, syncope, orthopnea, or PND.  (+)  ***Plan: -  Previous antihypertensives:   Past Medical History:  Diagnosis Date   Fibromyalgia     Past Surgical History:  Procedure Laterality Date   CESAREAN SECTION     FOOT SURGERY Bilateral    1st MTP Joint   ORIF ELBOW FRACTURE Right    ORIF FINGER / THUMB FRACTURE Right    Thumb    Current Medications: No outpatient medications have been marked as taking for the 04/03/22 encounter (Appointment) with Skeet Latch, MD.     Allergies:   Penicillin g   Social History   Socioeconomic History   Marital status: Married    Spouse name: Not on file   Number of children: 2   Years of education: Not on file   Highest education level: Some college, no degree  Occupational History   Occupation: Building services engineer    Comment: [employer]  Tobacco Use   Smoking status: Former   Smokeless tobacco: Never  Scientific laboratory technician Use: Never used  Substance and Sexual Activity   Alcohol use: Yes    Comment: occassional   Drug use: Not Currently    Types: "Crack" cocaine   Sexual activity: Yes  Other Topics Concern   Not on file  Social History Narrative   Jehovah's Witness   Social Determinants of Health   Financial Resource Strain: Not on file  Food Insecurity: Not on file  Transportation Needs: Not on file  Physical Activity: Not on file  Stress: Not on file  Social Connections: Not on  file     Family History: The patient's family history includes Breast cancer in her paternal grandmother; COPD in her mother; Cancer in her paternal grandmother; Diabetes in her father; Heart disease in her father; Hypertension in her father; Stroke in her father.  ROS:   Please see the history of present illness.     All other systems reviewed and are negative.  EKGs/Labs/Other Studies Reviewed:    ***  EKG:  EKG is personally reviewed. : Sinus ***. Rate *** bpm.  Recent Labs: No results found for requested labs within last 365 days.   Recent Lipid Panel    Component Value Date/Time   CHOL 232 (H) 01/18/2021 1050   TRIG 134.0 01/18/2021 1050   HDL 49.50 01/18/2021 1050   CHOLHDL 5 01/18/2021 1050   VLDL 26.8 01/18/2021 1050   LDLCALC 156 (H) 01/18/2021 1050    Physical Exam:    VS:  There were no vitals taken for this visit. , BMI There is no height or weight on file to calculate BMI. GENERAL:  Well appearing HEENT: Pupils equal round and reactive, fundi not visualized, oral mucosa unremarkable NECK:  No jugular venous distention, waveform within normal limits, carotid upstroke brisk and symmetric, no bruits, no thyromegaly LYMPHATICS:  No cervical adenopathy LUNGS:  Clear to auscultation bilaterally HEART:  RRR.  PMI not displaced  or sustained,S1 and S2 within normal limits, no S3, no S4, no clicks, no rubs, *** murmurs ABD:  Flat, positive bowel sounds normal in frequency in pitch, no bruits, no rebound, no guarding, no midline pulsatile mass, no hepatomegaly, no splenomegaly EXT:  2 plus pulses throughout, no edema, no cyanosis, no clubbing SKIN:  No rashes, no nodules NEURO:  Cranial nerves II through XII grossly intact, motor grossly intact throughout PSYCH:  Cognitively intact, oriented to person place and time   ASSESSMENT/PLAN:    No problem-specific Assessment & Plan notes found for this encounter.   Screening for Secondary Hypertension: { Click here to  document screening for secondary causes of HTN  :[id number]    Relevant Labs/Studies:    Latest Ref Rng & Units 03/29/2011    2:50 AM 07/28/2010    4:45 AM 07/23/2007    5:30 AM  Basic Labs  Sodium 135 - 145 mEq/L 139  139  139   Potassium 3.5 - 5.1 mEq/L 3.5  3.6  3.3   Creatinine 0.50 - 1.10 mg/dL 0.70  0.65  0.69                        she consents to be monitored in our remote patient monitoring program through [place].  she will track his blood pressure twice daily and understands that these trends will help [place] to adjust her medications as needed prior to his next appointment.  she *** interested in enrolling in the PREP exercise and nutrition program through the [facility].     Disposition:   *** FU with APP/PharmD in 1 month for the next 3 months.   FU with Tiffany C. Oval Linsey, MD, [facility] in 4 months.  Medication Adjustments/Labs and Tests Ordered: Current medicines are reviewed at length with the patient today.  Concerns regarding medicines are outlined above.   No orders of the defined types were placed in this encounter.  No orders of the defined types were placed in this encounter.   I,Mathew Stumpf,acting as a Education administrator for Skeet Latch, MD.,have documented all relevant documentation on the behalf of Skeet Latch, MD,as directed by  Skeet Latch, MD while in the presence of Skeet Latch, MD.  ***  Signed, Madelin Rear  03/31/2022 4:40 PM    [place]

## 2022-04-02 ENCOUNTER — Encounter (HOSPITAL_BASED_OUTPATIENT_CLINIC_OR_DEPARTMENT_OTHER): Payer: Self-pay | Admitting: *Deleted

## 2022-04-03 ENCOUNTER — Ambulatory Visit (HOSPITAL_BASED_OUTPATIENT_CLINIC_OR_DEPARTMENT_OTHER): Payer: BC Managed Care – PPO | Admitting: Cardiovascular Disease

## 2022-04-24 ENCOUNTER — Other Ambulatory Visit: Payer: Self-pay | Admitting: Family Medicine

## 2022-04-24 DIAGNOSIS — Z1231 Encounter for screening mammogram for malignant neoplasm of breast: Secondary | ICD-10-CM

## 2022-05-13 ENCOUNTER — Telehealth: Payer: Self-pay | Admitting: Family Medicine

## 2022-05-13 NOTE — Telephone Encounter (Signed)
Pt hung up on NT because she was at work. I made her an appt for tomorrow at 4:00 to discuss the side effects of effexor.

## 2022-05-13 NOTE — Telephone Encounter (Signed)
Spoke to patient, states that she started having HA's (RT side) x 4 days.  She recently had a toothache on RT side of mouth (which is better now).  She has been reading on internet regarding side effects of Effexor and has since stopped drinking (2 drinks daily)(x4 days).  She is worried about side effects of meds and was made an appointment 4/17//24 @ 3:00 pm to come in and discuss this.  Was advised that if for some reason she has a bad HA that isn't controled by taking either Tylenol or Ibuprofen to go be checked out.  Dm/cma

## 2022-05-13 NOTE — Telephone Encounter (Signed)
Caller Name: Rosella Crandell Ph #: [phone] Chief Complaint: Having a side effect from Effexor pain on left side of her head. It came on suddenly.   This call was transferred to Nurse Triage/Access Nurse. This is for documentation purposes. No follow up required at this time.

## 2022-05-14 ENCOUNTER — Ambulatory Visit (INDEPENDENT_AMBULATORY_CARE_PROVIDER_SITE_OTHER): Payer: BC Managed Care – PPO | Admitting: Family Medicine

## 2022-05-14 ENCOUNTER — Encounter: Payer: Self-pay | Admitting: Family Medicine

## 2022-05-14 ENCOUNTER — Ambulatory Visit: Payer: BC Managed Care – PPO | Admitting: Family Medicine

## 2022-05-14 VITALS — BP 136/78 | HR 80 | Temp 98.1°F | Resp 16 | Ht 67.0 in | Wt 200.5 lb

## 2022-05-14 DIAGNOSIS — F411 Generalized anxiety disorder: Secondary | ICD-10-CM | POA: Diagnosis not present

## 2022-05-14 DIAGNOSIS — F1090 Alcohol use, unspecified, uncomplicated: Secondary | ICD-10-CM | POA: Diagnosis not present

## 2022-05-14 NOTE — Assessment & Plan Note (Signed)
Exam does not show a specific neurological cause for the headaches. I suspect this may be due to alcohol withdrawal. I do not suspect venlafaxine as an underlying cause. I do support her decision to abstain from alcohol. I provided reassurance and we will monitor this for now.

## 2022-05-14 NOTE — Progress Notes (Signed)
[facility] [address] [place] [address] Dept: [phone] Dept Fax: [phone]  Office Visit  Subjective:    Patient ID: [name], female    DOB: March 08, 1965, 57 y.o..   MRN: [id number]  Chief Complaint  Patient presents with   Headache    Side effects from effexor?  Pain on R side of head, back pain  Admits to drinking wine on a daily basis w/ Effexor   History of Present Illness:  Patient is in today for concerned about a 3-4 day history of a right sided headache. The pain has been "dismal" and travels across the right side of the head and down into the neck. Having done some internet searches, she became concerned that this might be a side effect of her venlafaxine. She admits that she had been drinking more heavily and read that there could be serious side effects form using alcohol and venlafaxine at the same time. [name] has had significant anxiety issues related to her divorce form her husband. She admits that she had gotten into a daily routine of drinking 4-5 glasses of wine a day. She stopped this 4 days ago. She had been experiencing some regular GI upset, which is mildly improved since stopping alcohol. She had been worried she might be getting a stomach ulcer.  [name] also discloses that she recently called a Suicide Prevention line. She did not feel actively suicidal, but notes she had worries about self-destructive behaviors and some self-cutting that she had started to do. She is meeting iwht a counselor regularly now.  [name] notes that by summer, she plans to mover to [employer] She has already taken concrete steps in preparing for this.  Past Medical History: Patient Active Problem List   Diagnosis Date Noted   Essential hypertension 07/16/2021   Obesity (BMI 30.0-34.9) 01/18/2021   Anxiety disorder 01/18/2021   Perimenopausal symptoms 10/31/2015   Past Surgical History:   Procedure Laterality Date   CESAREAN SECTION     FOOT SURGERY Bilateral    1st MTP Joint   ORIF ELBOW FRACTURE Right    ORIF FINGER / THUMB FRACTURE Right    Thumb   Family History  Problem Relation Age of Onset   COPD Mother    Heart disease Father    Diabetes Father    Hypertension Father    Stroke Father    Breast cancer Paternal Grandmother    Cancer Paternal Grandmother        Breast   Outpatient Medications Prior to Visit  Medication Sig Dispense Refill   amLODipine (NORVASC) 5 MG tablet Take 1 tablet (5 mg total) by mouth daily. 30 tablet 3   Coenzyme Q10 (COQ10 PO) Take by mouth.     Evening Primrose Oil 1000 MG CAPS Take by mouth.     lactobacillus acidophilus (BACID) TABS tablet Take 2 tablets by mouth 3 (three) times daily.     MELATONIN-GABA-VALERIAN PO Take by mouth.     Omega 3 1000 MG CAPS Take by mouth.     Turmeric (QC TUMERIC COMPLEX PO) Take by mouth.     venlafaxine XR (EFFEXOR-XR) 150 MG 24 hr capsule Take 1 capsule (150 mg total) by mouth daily with breakfast. 30 capsule 5   vitamin B-12 (CYANOCOBALAMIN) 500 MCG tablet Take 500 mcg by mouth daily.     No facility-administered medications prior to visit.   Allergies  Allergen Reactions   Penicillin G Rash  Objective:   Today's Vitals   05/14/22 1501  BP: 136/78  Pulse: 80  Resp: 16  Temp: 98.1 F (36.7 C)  TempSrc: Oral  SpO2: 96%  Weight: 200 lb 8 oz (90.9 kg)  Height:  (1.702 m)   Body mass index is 31.4 kg/m.   General: Well developed, well nourished. No acute distress. HEENT: Normocephalic, non-traumatic. PERRL, EOMI. Conjunctiva clear. External ears normal. EAC and TMs normal   bilaterally. Nose clear without congestion or rhinorrhea. Mucous membranes moist. Oropharynx clear. Good dentition. Neuro: CN II-XII intact. Grip strength equal bilaterally. Psych: Alert and oriented. Normal mood and affect.  Health Maintenance Due  Topic Date Due   HIV Screening  Never done    PAP SMEAR-Modifier  08/23/2021     Assessment & Plan:   Problem List Items Addressed This Visit       Other   Anxiety disorder - Primary    I encouraged [name] to keep in touch with her counselor. I do not recommend stopping her venlafaxine. She should continue 150 mg daily. I will plan to see her back before her move.      Alcohol problem drinking    Exam does not show a specific neurological cause for the headaches. I suspect this may be due to alcohol withdrawal. I do not suspect venlafaxine as an underlying cause. I do support her decision to abstain from alcohol. I provided reassurance and we will monitor this for now.       Return in about 2 months (around 07/14/2022).   Loyola Mast, MD

## 2022-05-14 NOTE — Assessment & Plan Note (Signed)
I encouraged MS. Farias to keep in touch with her counselor. I do not recommend stopping her venlafaxine. She should continue 150 mg daily. I will plan to see her back before her move.

## 2022-05-17 ENCOUNTER — Other Ambulatory Visit: Payer: Self-pay | Admitting: Family Medicine

## 2022-05-17 DIAGNOSIS — I10 Essential (primary) hypertension: Secondary | ICD-10-CM

## 2022-05-22 ENCOUNTER — Encounter (HOSPITAL_BASED_OUTPATIENT_CLINIC_OR_DEPARTMENT_OTHER): Payer: Self-pay

## 2022-05-22 ENCOUNTER — Institutional Professional Consult (permissible substitution) (HOSPITAL_BASED_OUTPATIENT_CLINIC_OR_DEPARTMENT_OTHER): Payer: BC Managed Care – PPO | Admitting: Cardiovascular Disease

## 2022-05-22 NOTE — Progress Notes (Deleted)
Advanced Hypertension Clinic Initial Assessment:    Date:  05/22/2022   ID:  [name], DOB 1965-02-02, MRN [id number]  PCP:  Loyola Mast, MD  Cardiologist:  None  Nephrologist:  Referring MD: Maxie Better, MD   CC: Hypertension  History of Present Illness:    [name] is a 57 y.o. female with a hx of anxiety, obesity, and hypertension here to establish care in the Advanced Hypertension Clinic.   She saw her PCP 12/2021 and blood pressure was 138/84.  She is not on any antihypertensives at that time.  They recommended that she add amlodipine.  Previous antihypertensives:  Secondary Causes of Hypertension  Medications/Herbal: OCP, steroids, stimulants, antidepressants, weight loss medication, immune suppressants, NSAIDs, sympathomimetics, alcohol, caffeine, licorice, ginseng, St. John's wort, chemo  Sleep Apnea Renal artery stenosis Hyperaldosteronism Hyper/hypothyroidism Pheochromocytoma: palpitations, tachycardia, headache, diaphoresis (plasma metanephrines) Cushing's syndrome: Cushingoid facies, central obesity, proximal muscle weakness, and ecchymoses, adrenal incidentaloma (cortisol) Coarctation of the aorta  Past Medical History:  Diagnosis Date   Fibromyalgia     Past Surgical History:  Procedure Laterality Date   CESAREAN SECTION     FOOT SURGERY Bilateral    1st MTP Joint   ORIF ELBOW FRACTURE Right    ORIF FINGER / THUMB FRACTURE Right    Thumb    Current Medications: No outpatient medications have been marked as taking for the 05/22/22 encounter (Appointment) with Chilton Si, MD.     Allergies:   Penicillin g   Social History   Socioeconomic History   Marital status: Married    Spouse name: Not on file   Number of children: 2   Years of education: Not on file   Highest education level: Associate degree: occupational, Scientist, product/process development, or vocational program  Occupational History   Occupation: Electrical engineer    Comment:  [employer]  Tobacco Use   Smoking status: Former   Smokeless tobacco: Never  Building services engineer Use: Never used  Substance and Sexual Activity   Alcohol use: Yes    Comment: occassional   Drug use: Not Currently    Types: "Crack" cocaine   Sexual activity: Yes  Other Topics Concern   Not on file  Social History Narrative   Jehovah's Witness   Social Determinants of Health   Financial Resource Strain: Low Risk  (05/13/2022)   Overall Financial Resource Strain (CARDIA)    Difficulty of Paying Living Expenses: Not very hard  Food Insecurity: No Food Insecurity (05/13/2022)   Hunger Vital Sign    Worried About Running Out of Food in the Last Year: Never true    Ran Out of Food in the Last Year: Never true  Transportation Needs: No Transportation Needs (05/13/2022)   PRAPARE - Administrator, Civil Service (Medical): No    Lack of Transportation (Non-Medical): No  Physical Activity: Insufficiently Active (05/13/2022)   Exercise Vital Sign    Days of Exercise per Week: 3 days    Minutes of Exercise per Session: 30 min  Stress: No Stress Concern Present (05/13/2022)   [employer] of Occupational Health - Occupational Stress Questionnaire    Feeling of Stress : Only a little  Social Connections: Moderately Integrated (05/13/2022)   Social Connection and Isolation Panel [NHANES]    Frequency of Communication with Friends and Family: More than three times a week    Frequency of Social Gatherings with Friends and Family: Once a week    Attends Religious Services: More  than 4 times per year    Active Member of Clubs or Organizations: Yes    Attends Engineer, structural: More than 4 times per year    Marital Status: Separated     Family History: The patient's ***family history includes Breast cancer in her paternal grandmother; COPD in her mother; Cancer in her paternal grandmother; Diabetes in her father; Heart disease in her father; Hypertension in  her father; Stroke in her father.  ROS:   Please see the history of present illness.    *** All other systems reviewed and are negative.  EKGs/Labs/Other Studies Reviewed:    EKG:  EKG is *** ordered today.  The ekg ordered today demonstrates ***  Recent Labs: No results found for requested labs within last 365 days.   Recent Lipid Panel    Component Value Date/Time   CHOL 232 (H) 01/18/2021 1050   TRIG 134.0 01/18/2021 1050   HDL 49.50 01/18/2021 1050   CHOLHDL 5 01/18/2021 1050   VLDL 26.8 01/18/2021 1050   LDLCALC 156 (H) 01/18/2021 1050    Physical Exam:   VS:  There were no vitals taken for this visit. , BMI There is no height or weight on file to calculate BMI. GENERAL:  Well appearing HEENT: Pupils equal round and reactive, fundi not visualized, oral mucosa unremarkable NECK:  No jugular venous distention, waveform within normal limits, carotid upstroke brisk and symmetric, no bruits, no thyromegaly LYMPHATICS:  No cervical adenopathy LUNGS:  Clear to auscultation bilaterally HEART:  RRR.  PMI not displaced or sustained,S1 and S2 within normal limits, no S3, no S4, no clicks, no rubs, *** murmurs ABD:  Flat, positive bowel sounds normal in frequency in pitch, no bruits, no rebound, no guarding, no midline pulsatile mass, no hepatomegaly, no splenomegaly EXT:  2 plus pulses throughout, no edema, no cyanosis no clubbing SKIN:  No rashes no nodules NEURO:  Cranial nerves II through XII grossly intact, motor grossly intact throughout PSYCH:  Cognitively intact, oriented to person place and time   ASSESSMENT/PLAN:    No problem-specific Assessment & Plan notes found for this encounter.   Screening for Secondary Hypertension: { Click here to document screening for secondary causes of HTN  :[id number]}    Relevant Labs/Studies:    Latest Ref Rng & Units 03/29/2011    2:50 AM 07/28/2010    4:45 AM 07/23/2007    5:30 AM  Basic Labs  Sodium 135 - 145 mEq/L 139  139  139    Potassium 3.5 - 5.1 mEq/L 3.5  3.6  3.3   Creatinine 0.50 - 1.10 mg/dL 4.09  8.11  9.14        Disposition:    FU with MD/PharmD in {gen number [id number]} {Days to years:10300}    Medication Adjustments/Labs and Tests Ordered: Current medicines are reviewed at length with the patient today.  Concerns regarding medicines are outlined above.  No orders of the defined types were placed in this encounter.  No orders of the defined types were placed in this encounter.    Signed, Chilton Si, MD  05/22/2022 8:20 AM    [facility]

## 2022-05-27 ENCOUNTER — Other Ambulatory Visit: Payer: Self-pay

## 2022-05-27 ENCOUNTER — Emergency Department (HOSPITAL_COMMUNITY): Payer: BC Managed Care – PPO

## 2022-05-27 ENCOUNTER — Encounter (HOSPITAL_COMMUNITY): Payer: Self-pay

## 2022-05-27 ENCOUNTER — Emergency Department (HOSPITAL_COMMUNITY)
Admission: EM | Admit: 2022-05-27 | Discharge: 2022-05-27 | Disposition: A | Discharge status: Home / Self Care | Payer: BC Managed Care – PPO | Attending: Emergency Medicine | Admitting: Emergency Medicine

## 2022-05-27 DIAGNOSIS — R079 Chest pain, unspecified: Secondary | ICD-10-CM | POA: Diagnosis not present

## 2022-05-27 DIAGNOSIS — M545 Low back pain, unspecified: Secondary | ICD-10-CM | POA: Insufficient documentation

## 2022-05-27 DIAGNOSIS — Z79899 Other long term (current) drug therapy: Secondary | ICD-10-CM | POA: Insufficient documentation

## 2022-05-27 DIAGNOSIS — I10 Essential (primary) hypertension: Secondary | ICD-10-CM | POA: Insufficient documentation

## 2022-05-27 DIAGNOSIS — Z87891 Personal history of nicotine dependence: Secondary | ICD-10-CM | POA: Insufficient documentation

## 2022-05-27 DIAGNOSIS — R0789 Other chest pain: Secondary | ICD-10-CM | POA: Insufficient documentation

## 2022-05-27 DIAGNOSIS — M546 Pain in thoracic spine: Secondary | ICD-10-CM | POA: Insufficient documentation

## 2022-05-27 HISTORY — DX: Anxiety disorder, unspecified: F41.9

## 2022-05-27 HISTORY — DX: Essential (primary) hypertension: I10

## 2022-05-27 LAB — BASIC METABOLIC PANEL
Anion gap: 10 (ref 5–15)
BUN: 12 mg/dL (ref 6–20)
CO2: 24 mmol/L (ref 22–32)
Calcium: 9.7 mg/dL (ref 8.9–10.3)
Chloride: 101 mmol/L (ref 98–111)
Creatinine, Ser: 0.68 mg/dL (ref 0.44–1.00)
GFR, Estimated: >60 mL/min (ref >60)
Glucose, Bld: 105 mg/dL — ABNORMAL HIGH (ref 70–99)
Potassium: 4.1 mmol/L (ref 3.5–5.1)
Sodium: 135 mmol/L (ref 135–145)

## 2022-05-27 LAB — CBC
HCT: 48.2 % — ABNORMAL HIGH (ref 36.0–46.0)
Hemoglobin: 16.5 g/dL — ABNORMAL HIGH (ref 12.0–15.0)
MCH: 33.5 pg (ref 26.0–34.0)
MCHC: 34.2 g/dL (ref 30.0–36.0)
MCV: 98 fL (ref 80.0–100.0)
Platelets: 266 10*3/uL (ref 150–400)
RBC: 4.92 MIL/uL (ref 3.87–5.11)
RDW: 12.6 % (ref 11.5–15.5)
WBC: 14.3 10*3/uL — ABNORMAL HIGH (ref 4.0–10.5)
nRBC: 0 % (ref 0.0–0.2)

## 2022-05-27 LAB — TROPONIN I (HIGH SENSITIVITY)
Troponin I (High Sensitivity): <2 ng/L (ref <18)
Troponin I (High Sensitivity): <2 ng/L (ref <18)

## 2022-05-27 LAB — D-DIMER, QUANTITATIVE: D-Dimer, Quant: 0.27 ug/mL-FEU (ref 0.00–0.50)

## 2022-05-27 MED ORDER — ALUM & MAG HYDROXIDE-SIMETH 200-200-20 MG/5ML PO SUSP
30.0000 mL | Freq: Once | ORAL | Status: AC
  Administered 2022-05-27: 30 mL via ORAL
  Filled 2022-05-27: qty 30

## 2022-05-27 NOTE — Discharge Instructions (Signed)
Try pepcid or tagamet up to twice a day.  Try to avoid things that may make this worse, most commonly these are spicy foods tomato based products fatty foods chocolate and peppermint.  Alcohol and tobacco can also make this worse.  Return to the emergency department for sudden worsening pain fever or inability to eat or drink.  Your test were negative for heart attack, your D-dimer test was also negative.  Your chest x-ray did not show a pneumonia or collapsed lung.  Will treat this like maybe it is reflux.  Please follow-up with your family doctor in the office.

## 2022-05-27 NOTE — ED Provider Notes (Signed)
[place] EMERGENCY DEPARTMENT AT [facility] Provider Note   CSN: [id number] Arrival date & time: 05/27/22  1457     History  Chief Complaint  Patient presents with   Anxiety   Chest Pain    [name] is a 57 y.o. female.  57 yo F with a chief complaints of chest pain.  This been going on since yesterday evening.  She ate a vegan pizza and then developed some discomfort to her chest.  She also felt it in her upper and lower back.  Nothing seems to make it better or worse.  She has been sweaty off and on but she thinks that is due to her menopause.  She had a subtoday for lunch and immediately thereafter developed some recurrence of her discomfort.  Patient denies history of MI, denies  hyperlipidemia diabetes.  Patient's father had an MI in his mid 39s.  She has a history of hypertension and is a current smoker.  Patient denies history of PE or DVT denies hemoptysis denies unilateral lower extremity edema denies recent surgery immobilization hospitalization estrogen use or history of cancer.     Anxiety Associated symptoms include chest pain.  Chest Pain Associated symptoms: anxiety        Home Medications Prior to Admission medications   Medication Sig Start Date End Date Taking? Authorizing Provider  amLODipine (NORVASC) 5 MG tablet Take 1 tablet by mouth once daily 05/17/22  Yes Rudd, Bertram Millard, MD  Coenzyme Q10 (COQ10 PO) Take 1 tablet by mouth daily.   Yes [provider]  Evening Primrose Oil 1000 MG CAPS Take 1 capsule by mouth daily.   Yes [provider]  Omega 3 1000 MG CAPS Take 1 capsule by mouth daily.   Yes [provider]  Turmeric (QC TUMERIC COMPLEX PO) Take 1 tablet by mouth daily.   Yes [provider]  venlafaxine XR (EFFEXOR-XR) 150 MG 24 hr capsule Take 1 capsule (150 mg total) by mouth daily with breakfast. 12/04/21  Yes Rudd, Bertram Millard, MD  vitamin B-12 (CYANOCOBALAMIN) 500 MCG tablet Take 500 mcg  by mouth daily.   Yes [provider]      Allergies    Penicillin g    Review of Systems   Review of Systems  Cardiovascular:  Positive for chest pain.    Physical Exam Updated Vital Signs BP (!) 145/97   Pulse 95   Temp 98.7 F (37.1 C) (Oral)   Resp 18   Ht 5\' 7"  (1.702 m)   Wt 90.7 kg   SpO2 96%   BMI 31.32 kg/m  Physical Exam Vitals and nursing note reviewed.  Constitutional:      General: She is not in acute distress.    Appearance: She is well-developed. She is not diaphoretic.  HENT:     Head: Normocephalic and atraumatic.  Eyes:     Pupils: Pupils are equal, round, and reactive to light.  Cardiovascular:     Rate and Rhythm: Normal rate and regular rhythm.     Heart sounds: No murmur heard.    No friction rub. No gallop.  Pulmonary:     Effort: Pulmonary effort is normal.     Breath sounds: No wheezing or rales.  Chest:     Chest wall: Tenderness present.     Comments: Some pain on palpation about the sternum.  Abdominal:     General: There is no distension.     Palpations: Abdomen is  soft.     Tenderness: There is no abdominal tenderness.  Musculoskeletal:        General: No tenderness.     Cervical back: Normal range of motion and neck supple.  Skin:    General: Skin is warm and dry.  Neurological:     Mental Status: She is alert and oriented to person, place, and time.  Psychiatric:        Behavior: Behavior normal.     ED Results / Procedures / Treatments   Labs (all labs ordered are listed, but only abnormal results are displayed) Labs Reviewed  BASIC METABOLIC PANEL - Abnormal; Notable for the following components:      Result Value   Glucose, Bld 105 (*)    All other components within normal limits  CBC - Abnormal; Notable for the following components:   WBC 14.3 (*)    Hemoglobin 16.5 (*)    HCT 48.2 (*)    All other components within normal limits  D-DIMER, QUANTITATIVE  TROPONIN I (HIGH SENSITIVITY)  TROPONIN I (HIGH  SENSITIVITY)    EKG EKG Interpretation  Date/Time:  Tuesday May 27 2022 15:05:53 EDT Ventricular Rate:  107 PR Interval:  142 QRS Duration: 91 QT Interval:  317 QTC Calculation: 423 R Axis:   138 Text Interpretation: Sinus tachycardia Left atrial enlargement Right axis deviation Low voltage, precordial leads Since last tracing rate faster Otherwise no significant change Confirmed by Melene Plan [phone]) on 05/27/2022 3:20:57 PM  Radiology DG Chest 2 View  Result Date: 05/27/2022 CLINICAL DATA:  Chest pain EXAM: CHEST - 2 VIEW COMPARISON:  None Available. FINDINGS: Cardiac and mediastinal contours are within normal limits. No focal pulmonary opacity. No pleural effusion or pneumothorax. No acute osseous abnormality. IMPRESSION: No acute cardiopulmonary process. Electronically Signed   By: Wiliam Ke M.D.   On: 05/27/2022 15:35    Procedures Procedures    Medications Ordered in ED Medications  alum & mag hydroxide-simeth (MAALOX/MYLANTA) 200-200-20 MG/5ML suspension 30 mL (30 mLs Oral Given 05/27/22 1559)    ED Course/ Medical Decision Making/ A&P                             Medical Decision Making Amount and/or Complexity of Data Reviewed Labs: ordered. Radiology: ordered.  Risk OTC drugs.   58 yo F with a cc of chest pain.  This been going on since yesterday evening after she had something to eat.  Seems likely to be reflux based on the history, worse with certain foods.  EKG with some sinus tachycardia, will obtain a D-dimer.  Blood work.  Chest x-ray independently interpreted by me without focal infiltrate or pneumothorax.  2 troponins are negative, no significant electrolyte abnormality, no anemia, D-dimer negative.    Will discharge the patient home.  PCP follow-up.  7:01 PM:  I have discussed the diagnosis/risks/treatment options with the patient.  Evaluation and diagnostic testing in the emergency department does not suggest an emergent condition requiring  admission or immediate intervention beyond what has been performed at this time.  They will follow up with PCP. We also discussed returning to the ED immediately if new or worsening sx occur. We discussed the sx which are most concerning (e.g., sudden worsening pain, fever, inability to tolerate by mouth) that necessitate immediate return. Medications administered to the patient during their visit and any new prescriptions provided to the patient are listed below.  Medications given during  this visit Medications  alum & mag hydroxide-simeth (MAALOX/MYLANTA) 200-200-20 MG/5ML suspension 30 mL (30 mLs Oral Given 05/27/22 1559)     The patient appears reasonably screen and/or stabilized for discharge and I doubt any other medical condition or other [facility] requiring further screening, evaluation, or treatment in the ED at this time prior to discharge.          Final Clinical Impression(s) / ED Diagnoses Final diagnoses:  Nonspecific chest pain    Rx / DC Orders ED Discharge Orders     None         Melene Plan, DO 05/27/22 1901

## 2022-05-27 NOTE — ED Triage Notes (Signed)
Left sided chest pain for 2 days, described at tightness. Pt states right arm was tingling earlier today. Pt states she feels sob and nauseous.  Hx of anxiety attacks and has not missed her anxiety medications today.

## 2022-05-30 ENCOUNTER — Telehealth: Payer: Self-pay

## 2022-05-30 NOTE — Transitions of Care (Post Inpatient/ED Visit) (Unsigned)
   05/30/2022  Name: Rexanne Sadoff MRN: [id number] DOB: [birth year]  Today's TOC FU Call Status: Today's TOC FU Call Status:: Unsuccessul Call (1st Attempt) Unsuccessful Call (1st Attempt) Date: 05/30/22  Attempted to reach the patient regarding the most recent Inpatient/ED visit.  Follow Up Plan: Additional outreach attempts will be made to reach the patient to complete the Transitions of Care (Post Inpatient/ED visit) call.   Signature Arvil Persons, BSN, Charity fundraiser

## 2022-06-04 NOTE — Transitions of Care (Post Inpatient/ED Visit) (Signed)
   06/04/2022  Name: [name] MRN: [id number] DOB: 03-Jun-1965  Today's TOC FU Call Status: Today's TOC FU Call Status:: Unsuccessul Call (1st Attempt) Unsuccessful Call (1st Attempt) Date: 06/04/22  Transition Care Management Follow-up Telephone Call Date of Discharge: 05/27/22 Discharge Facility: [facility]) Type of Discharge: Emergency Department Reason for ED Visit:  (nonspecific chest pain) How have you been since you were released from the hospital?: Better Any questions or concerns?: No  Items Reviewed: Did you receive and understand the discharge instructions provided?: Yes Medications obtained,verified, and reconciled?: No Any new allergies since your discharge?: No Dietary orders reviewed?: Yes Do you have support at home?: Yes  Medications Reviewed Today: Medications Reviewed Today     Reviewed by Larey Dresser, RN (Registered Nurse) on 06/04/22 at [phone]  Med List Status: <None>   Medication Order Taking? Sig Documenting Provider Last Dose Status Informant  amLODipine (NORVASC) 5 MG tablet [id number] Yes Take 1 tablet by mouth once daily Loyola Mast, MD Taking Active Self  Coenzyme Q10 (COQ10 PO) [id number] Yes Take 1 tablet by mouth daily. [provider] Taking Active Self  Evening Primrose Oil 1000 MG CAPS [id number] Yes Take 1 capsule by mouth daily. [provider] Taking Active Self  Omega 3 1000 MG CAPS [id number] Yes Take 1 capsule by mouth daily. [provider] Taking Active Self  Turmeric (QC TUMERIC COMPLEX PO) [id number] Yes Take 1 tablet by mouth daily. [provider] Taking Active Self  venlafaxine XR (EFFEXOR-XR) 150 MG 24 hr capsule [id number] Yes Take 1 capsule (150 mg total) by mouth daily with breakfast. Loyola Mast, MD Taking Active Self  vitamin B-12 (CYANOCOBALAMIN) 500 MCG tablet [id number] Yes Take 500 mcg by mouth daily. [provider] Taking Active Self            Home Care  and Equipment/Supplies: Were Home Health Services Ordered?: NA Any new equipment or medical supplies ordered?: NA  Functional Questionnaire: Do you need assistance with bathing/showering or dressing?: No Do you need assistance with meal preparation?: No Do you need assistance with eating?: No Do you have difficulty maintaining continence: No Do you need assistance with getting out of bed/getting out of a chair/moving?: No Do you have difficulty managing or taking your medications?: No  Follow up appointments reviewed: PCP Follow-up appointment confirmed?: NA Specialist Hospital Follow-up appointment confirmed?: NA Do you need transportation to your follow-up appointment?: No Do you understand care options if your condition(s) worsen?: Yes-patient verbalized understanding    SIGNATURE Arvil Persons, BSN, RN

## 2022-06-14 ENCOUNTER — Other Ambulatory Visit: Payer: Self-pay | Admitting: Family Medicine

## 2022-06-14 DIAGNOSIS — F411 Generalized anxiety disorder: Secondary | ICD-10-CM

## 2022-06-18 ENCOUNTER — Ambulatory Visit
Admission: RE | Admit: 2022-06-18 | Discharge: 2022-06-18 | Disposition: A | Discharge status: Home / Self Care | Payer: BC Managed Care – PPO | Source: Ambulatory Visit | Attending: Family Medicine | Admitting: Family Medicine

## 2022-06-18 DIAGNOSIS — Z1231 Encounter for screening mammogram for malignant neoplasm of breast: Secondary | ICD-10-CM | POA: Diagnosis not present

## 2022-06-27 ENCOUNTER — Telehealth: Payer: Self-pay | Admitting: Family Medicine

## 2022-06-27 NOTE — Telephone Encounter (Signed)
[name] please call she has questions about transferring her meds to [place].

## 2022-06-30 ENCOUNTER — Inpatient Hospital Stay: Admission: RE | Admit: 2022-06-30 | Payer: BC Managed Care – PPO | Source: Ambulatory Visit

## 2022-07-01 NOTE — Telephone Encounter (Signed)
Spoke to patient, she states that she will be using a [facility] in [place] as well and they stated that they could transfer them from store to store.  Advised if she runs into any issues to please let us know.  No further questions. Dm/cma

## 2022-07-15 ENCOUNTER — Ambulatory Visit (INDEPENDENT_AMBULATORY_CARE_PROVIDER_SITE_OTHER): Payer: BC Managed Care – PPO | Admitting: Family Medicine

## 2022-07-15 ENCOUNTER — Encounter: Payer: Self-pay | Admitting: Family Medicine

## 2022-07-15 VITALS — BP 138/90 | HR 96 | Temp 97.9°F | Resp 16 | Ht 67.0 in | Wt 197.0 lb

## 2022-07-15 DIAGNOSIS — F411 Generalized anxiety disorder: Secondary | ICD-10-CM | POA: Diagnosis not present

## 2022-07-15 DIAGNOSIS — I10 Essential (primary) hypertension: Secondary | ICD-10-CM | POA: Diagnosis not present

## 2022-07-15 MED ORDER — AMLODIPINE BESY-BENAZEPRIL HCL 5-10 MG PO CAPS: 1.0000 | ORAL_CAPSULE | Freq: Every day | ORAL | 3 refills | Status: AC

## 2022-07-15 NOTE — Assessment & Plan Note (Signed)
BP remains above goal today. I will switch her to amlodipine-benazepril 5-10 mg daily. Recommend she follow-up with a physician in [place] once she arrives there.

## 2022-07-15 NOTE — Assessment & Plan Note (Signed)
Improved. Continue venlafaxine 150 mg daily.I also recommend ongoing counseling.

## 2022-07-15 NOTE — Progress Notes (Signed)
[facility] [address] [place] [address] Dept: [phone] Dept Fax: [phone]  Office Visit  Subjective:    Patient ID: [name], female    DOB: 02-04-1965, 57 y.o..   MRN: [id number]  Chief Complaint  Patient presents with   Medical Management of Chronic Issues   History of Present Illness:  Patient is in today for reassessment of her mood. She has a history of anxiety issues which have been fueled by issues related to her divorce. [name] had developed a routine for excessive alcohol use and self-destructive behaviors and some self-cutting. She started to engage with counseling. I had her continue venlafaxine 150 mg daily. She notes since that last visit, her mood is doing much better. She has reduced her alcohol use significantly. She is planning ot initiate her move to the [facility] [place] area in the next 10 days. She feels hopeful about the future. She admits to some reasonable anxieties about her drive across country, but is ready for this.  [name] was diagnosed with hypertension last Winter. She is currently managed on amlodipine 5 mg daily.  Past Medical History: Patient Active Problem List   Diagnosis Date Noted   Alcohol problem drinking 05/14/2022   Essential hypertension 07/16/2021   Obesity (BMI 30.0-34.9) 01/18/2021   Anxiety disorder 01/18/2021   Perimenopausal symptoms 10/31/2015   Past Surgical History:  Procedure Laterality Date   CESAREAN SECTION     FOOT SURGERY Bilateral    1st MTP Joint   ORIF ELBOW FRACTURE Right    ORIF FINGER / THUMB FRACTURE Right    Thumb   Family History  Problem Relation Age of Onset   COPD Mother    Heart disease Father    Diabetes Father    Hypertension Father    Stroke Father    Breast cancer Paternal Grandmother    Cancer Paternal Grandmother        Breast   Outpatient Medications Prior to Visit  Medication Sig Dispense Refill   Coenzyme  Q10 (COQ10 PO) Take 1 tablet by mouth daily.     Evening Primrose Oil 1000 MG CAPS Take 1 capsule by mouth daily.     Omega 3 1000 MG CAPS Take 1 capsule by mouth daily.     Turmeric (QC TUMERIC COMPLEX PO) Take 1 tablet by mouth daily.     venlafaxine XR (EFFEXOR-XR) 150 MG 24 hr capsule TAKE 1 CAPSULE BY MOUTH ONCE DAILY WITH BREAKFAST 90 capsule 3   vitamin B-12 (CYANOCOBALAMIN) 500 MCG tablet Take 500 mcg by mouth daily.     amLODipine (NORVASC) 5 MG tablet Take 1 tablet by mouth once daily 90 tablet 3   No facility-administered medications prior to visit.   Allergies  Allergen Reactions   Penicillin G Rash     Objective:   Today's Vitals   07/15/22 1459  BP: (!) 138/90  Pulse: 96  Resp: 16  Temp: 97.9 F (36.6 C)  SpO2: 98%  Weight: 197 lb (89.4 kg)  Height: 5\' 7"  (1.702 m)   Body mass index is 30.85 kg/m.   General: Well developed, well nourished. No acute distress. Psych: Alert and oriented. Normal mood and affect.  Health Maintenance Due  Topic Date Due   HIV Screening  Never done   PAP SMEAR-Modifier  08/23/2021     Assessment & Plan:   Problem List Items Addressed This Visit       Cardiovascular and Mediastinum  Essential hypertension - Primary    BP remains above goal today. I will switch her to amlodipine-benazepril 5-10 mg daily. Recommend she follow-up with a physician in [place] once she arrives there.      Relevant Medications   amLODipine-benazepril (LOTREL) 5-10 MG capsule     Other   Anxiety disorder    Improved. Continue venlafaxine 150 mg daily.I also recommend ongoing counseling.       Return if symptoms worsen or fail to improve.   Loyola Mast, MD

## 2023-06-13 ENCOUNTER — Other Ambulatory Visit: Payer: Self-pay | Admitting: Family Medicine

## 2023-06-13 DIAGNOSIS — I10 Essential (primary) hypertension: Secondary | ICD-10-CM

## 2023-06-23 ENCOUNTER — Other Ambulatory Visit: Payer: Self-pay | Admitting: Family Medicine

## 2023-06-23 DIAGNOSIS — F411 Generalized anxiety disorder: Secondary | ICD-10-CM

## 2023-06-23 DIAGNOSIS — I10 Essential (primary) hypertension: Secondary | ICD-10-CM

## 2023-12-30 IMAGING — MG MM DIGITAL SCREENING BILAT W/ TOMO AND CAD
8 series · 8 of 24 positions shown · non-contrast
Comparison: Previous exam(s).

CLINICAL DATA: Screening.

EXAM:
DIGITAL SCREENING BILATERAL MAMMOGRAM WITH TOMOSYNTHESIS AND CAD
TECHNIQUE: Bilateral screening digital craniocaudal and mediolateral oblique
mammograms were obtained. Bilateral screening digital breast
tomosynthesis was performed. The images were evaluated with
computer-aided detection.

[R MLO synth-2D]
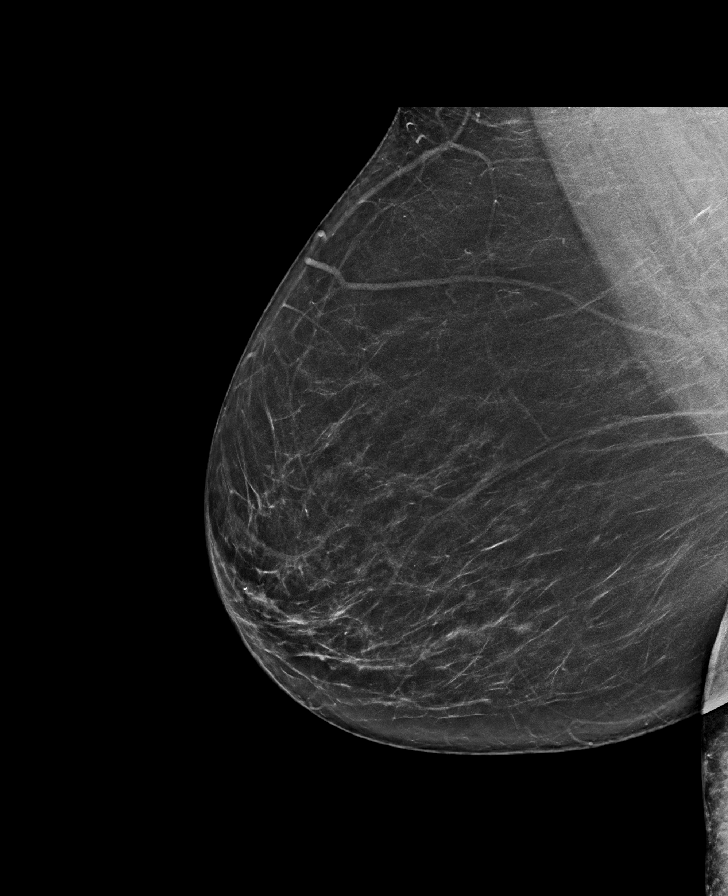

[R CC synth-2D]
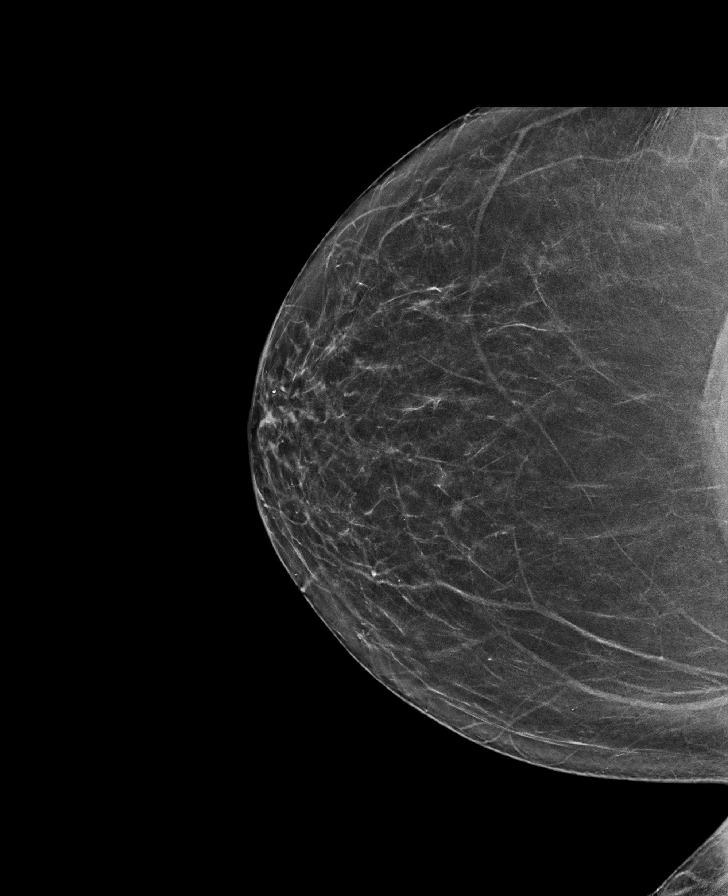

[L MLO synth-2D]
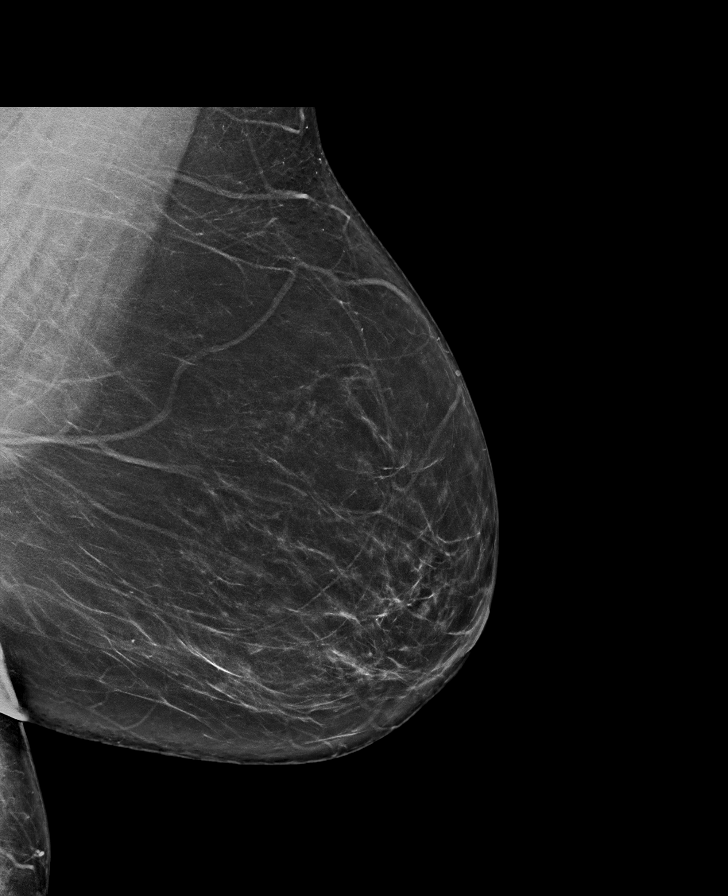

[L CC synth-2D]
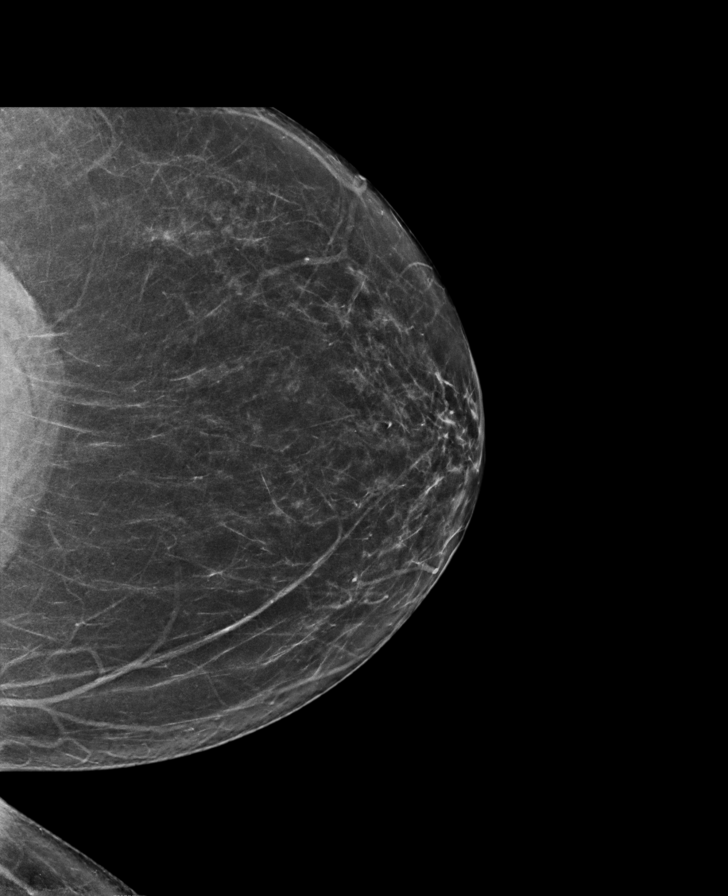

[L CC tomo · tomo slice 33/64.0]
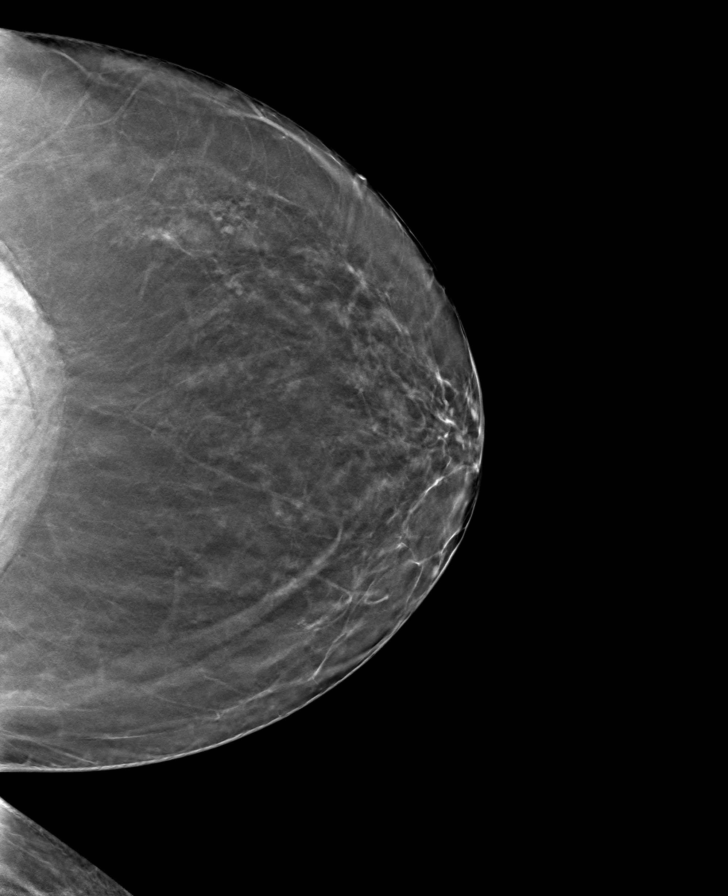

[R CC tomo · tomo slice 32/63.0]
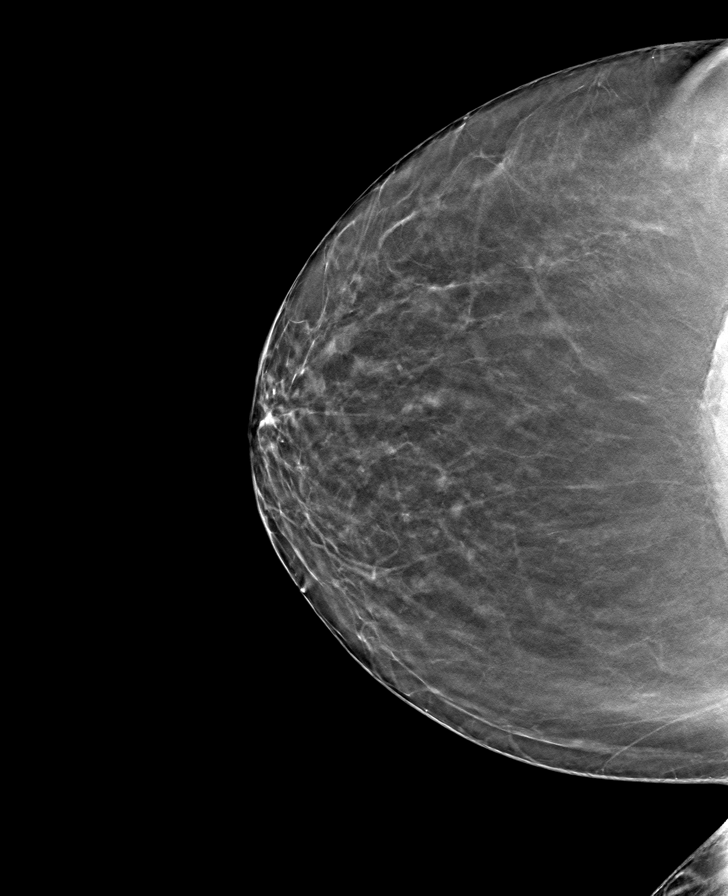

[L MLO tomo · tomo slice 39/76.0]
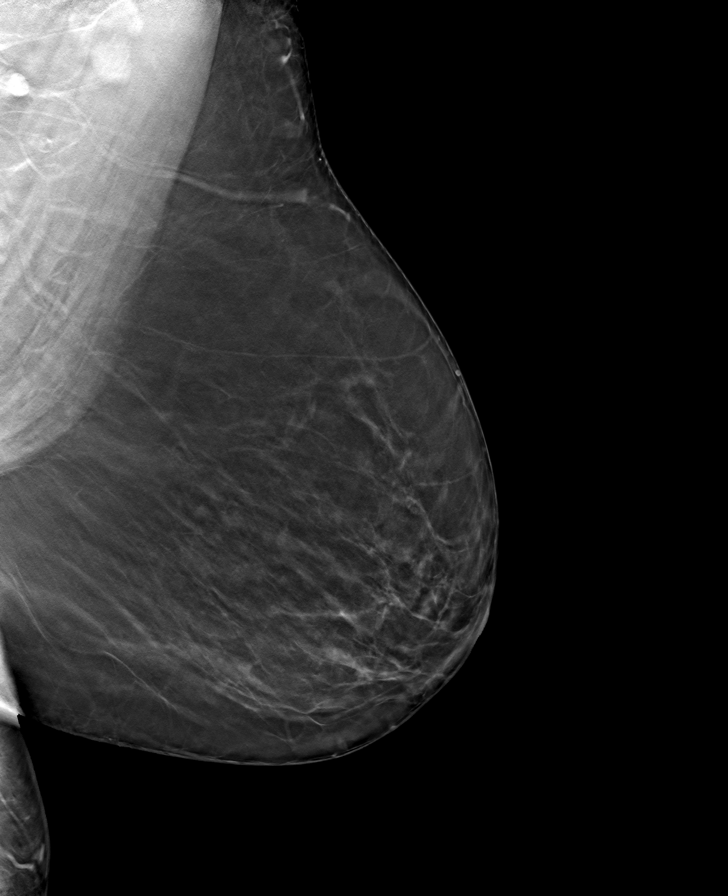

[R MLO tomo · tomo slice 36/71.0]
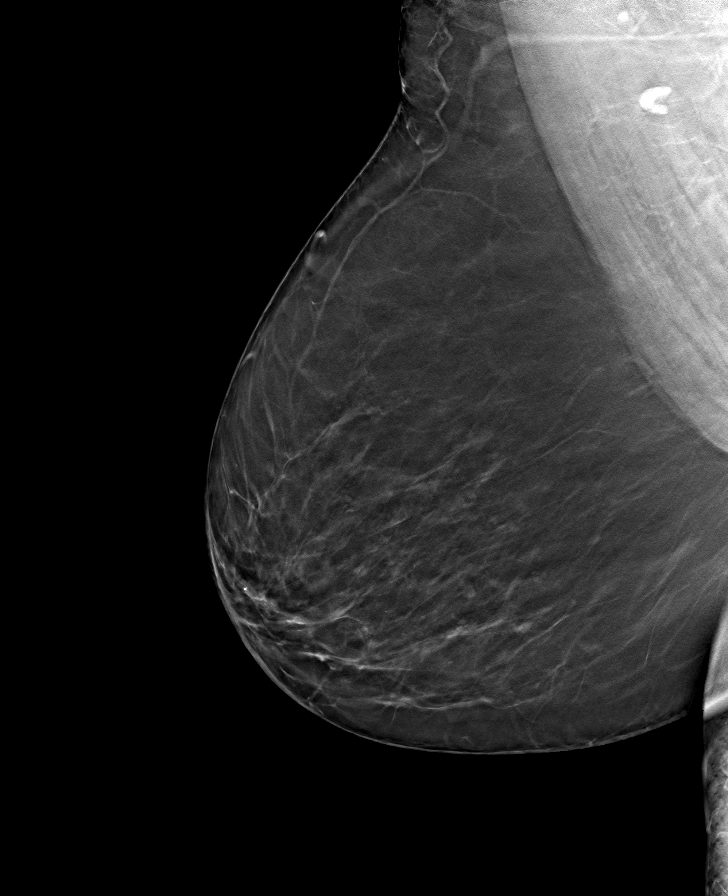

[8 of 24 positions shown; findings below may reference images not displayed]

ACR Breast Density Category b: There are scattered areas of
fibroglandular density.
FINDINGS: There are no findings suspicious for malignancy.
IMPRESSION: No mammographic evidence of malignancy. A result letter of this
screening mammogram will be mailed directly to the patient.

RECOMMENDATION:
Screening mammogram in one year. (Code:51-O-LD2)

BI-RADS CATEGORY  1: Negative.
